# Patient Record
Sex: Male | Born: 1950 | Race: Black or African American | Hispanic: No | Smoking: Current every day smoker
Health system: Southern US, Community
[De-identification: ages and names within clinical notes are randomized; demographics above are authoritative.]

## PROBLEM LIST (undated history)

## (undated) DIAGNOSIS — S065X9A Traumatic subdural hemorrhage with loss of consciousness of unspecified duration, initial encounter: Secondary | ICD-10-CM

## (undated) DIAGNOSIS — K219 Gastro-esophageal reflux disease without esophagitis: Secondary | ICD-10-CM

## (undated) DIAGNOSIS — W3400XA Accidental discharge from unspecified firearms or gun, initial encounter: Secondary | ICD-10-CM

## (undated) DIAGNOSIS — S065XAA Traumatic subdural hemorrhage with loss of consciousness status unknown, initial encounter: Secondary | ICD-10-CM

## (undated) DIAGNOSIS — D496 Neoplasm of unspecified behavior of brain: Secondary | ICD-10-CM

## (undated) DIAGNOSIS — I639 Cerebral infarction, unspecified: Secondary | ICD-10-CM

## (undated) HISTORY — PX: OTHER SURGICAL HISTORY: SHX169

---

## 1997-12-27 ENCOUNTER — Emergency Department (HOSPITAL_COMMUNITY): Admission: EM | Admit: 1997-12-27 | Discharge: 1997-12-27 | Payer: Self-pay | Admitting: Emergency Medicine

## 1998-08-17 ENCOUNTER — Emergency Department (HOSPITAL_COMMUNITY): Admission: EM | Admit: 1998-08-17 | Discharge: 1998-08-17 | Payer: Self-pay | Admitting: Emergency Medicine

## 1999-03-02 ENCOUNTER — Emergency Department (HOSPITAL_COMMUNITY): Admission: EM | Admit: 1999-03-02 | Discharge: 1999-03-02 | Payer: Self-pay | Admitting: Emergency Medicine

## 1999-04-11 ENCOUNTER — Emergency Department (HOSPITAL_COMMUNITY): Admission: EM | Admit: 1999-04-11 | Discharge: 1999-04-11 | Payer: Self-pay | Admitting: Emergency Medicine

## 2000-01-06 ENCOUNTER — Encounter: Payer: Self-pay | Admitting: Emergency Medicine

## 2000-01-06 ENCOUNTER — Emergency Department (HOSPITAL_COMMUNITY): Admission: EM | Admit: 2000-01-06 | Discharge: 2000-01-06 | Payer: Self-pay | Admitting: Emergency Medicine

## 2000-02-15 ENCOUNTER — Emergency Department (HOSPITAL_COMMUNITY): Admission: EM | Admit: 2000-02-15 | Discharge: 2000-02-15 | Payer: Self-pay | Admitting: *Deleted

## 2000-02-15 ENCOUNTER — Encounter: Payer: Self-pay | Admitting: Emergency Medicine

## 2001-07-05 ENCOUNTER — Emergency Department (HOSPITAL_COMMUNITY): Admission: EM | Admit: 2001-07-05 | Discharge: 2001-07-05 | Payer: Self-pay | Admitting: Emergency Medicine

## 2002-09-21 ENCOUNTER — Emergency Department (HOSPITAL_COMMUNITY): Admission: EM | Admit: 2002-09-21 | Discharge: 2002-09-21 | Payer: Self-pay | Admitting: Emergency Medicine

## 2003-02-04 ENCOUNTER — Emergency Department (HOSPITAL_COMMUNITY): Admission: EM | Admit: 2003-02-04 | Discharge: 2003-02-04 | Payer: Self-pay

## 2003-07-16 ENCOUNTER — Emergency Department (HOSPITAL_COMMUNITY): Admission: EM | Admit: 2003-07-16 | Discharge: 2003-07-16 | Payer: Self-pay | Admitting: Emergency Medicine

## 2004-09-25 ENCOUNTER — Ambulatory Visit: Payer: Self-pay | Admitting: Internal Medicine

## 2004-09-28 ENCOUNTER — Ambulatory Visit: Payer: Self-pay | Admitting: *Deleted

## 2004-10-09 ENCOUNTER — Ambulatory Visit: Payer: Self-pay | Admitting: Family Medicine

## 2004-10-19 ENCOUNTER — Ambulatory Visit: Payer: Self-pay | Admitting: Internal Medicine

## 2005-09-10 ENCOUNTER — Ambulatory Visit: Payer: Self-pay | Admitting: Family Medicine

## 2005-10-06 ENCOUNTER — Ambulatory Visit: Payer: Self-pay | Admitting: Gastroenterology

## 2006-01-04 ENCOUNTER — Other Ambulatory Visit: Payer: Self-pay

## 2006-01-04 ENCOUNTER — Inpatient Hospital Stay: Payer: Self-pay | Admitting: Internal Medicine

## 2007-04-15 ENCOUNTER — Emergency Department: Payer: Self-pay | Admitting: Emergency Medicine

## 2007-04-15 ENCOUNTER — Other Ambulatory Visit: Payer: Self-pay

## 2007-04-27 ENCOUNTER — Ambulatory Visit: Payer: Self-pay | Admitting: Internal Medicine

## 2008-11-25 ENCOUNTER — Emergency Department: Payer: Self-pay | Admitting: Emergency Medicine

## 2009-04-08 ENCOUNTER — Emergency Department (HOSPITAL_COMMUNITY): Admission: EM | Admit: 2009-04-08 | Discharge: 2009-04-08 | Payer: Self-pay | Admitting: Emergency Medicine

## 2010-01-20 ENCOUNTER — Encounter: Payer: Self-pay | Admitting: Emergency Medicine

## 2010-01-20 ENCOUNTER — Inpatient Hospital Stay (HOSPITAL_COMMUNITY): Admission: EM | Admit: 2010-01-20 | Discharge: 2010-01-23 | Payer: Self-pay | Admitting: Neurosurgery

## 2010-03-12 ENCOUNTER — Ambulatory Visit (HOSPITAL_COMMUNITY): Admission: RE | Admit: 2010-03-12 | Discharge: 2010-03-12 | Payer: Self-pay | Admitting: Neurosurgery

## 2010-09-07 ENCOUNTER — Emergency Department: Payer: Self-pay | Admitting: Emergency Medicine

## 2010-09-08 ENCOUNTER — Inpatient Hospital Stay (HOSPITAL_COMMUNITY)
Admission: EM | Admit: 2010-09-08 | Discharge: 2010-09-18 | Payer: Self-pay | Attending: Neurosurgery | Admitting: Neurosurgery

## 2010-09-09 LAB — BASIC METABOLIC PANEL
BUN: 10 mg/dL (ref 6–23)
CO2: 27 mEq/L (ref 19–32)
Calcium: 9.8 mg/dL (ref 8.4–10.5)
Chloride: 104 mEq/L (ref 96–112)
Creatinine, Ser: 1.1 mg/dL (ref 0.4–1.5)
GFR calc Af Amer: >60 mL/min (ref >60)
GFR calc non Af Amer: >60 mL/min (ref >60)
Glucose, Bld: 227 mg/dL — ABNORMAL HIGH (ref 70–99)
Potassium: 4.5 mEq/L (ref 3.5–5.1)
Sodium: 139 mEq/L (ref 135–145)

## 2010-09-09 LAB — PROTIME-INR
INR: 0.92 (ref 0.00–1.49)
Prothrombin Time: 12.6 seconds (ref 11.6–15.2)

## 2010-09-09 LAB — CBC
HCT: 44.9 % (ref 39.0–52.0)
Hemoglobin: 15.7 g/dL (ref 13.0–17.0)
MCH: 29.6 pg (ref 26.0–34.0)
MCHC: 35 g/dL (ref 30.0–36.0)
MCV: 84.6 fL (ref 78.0–100.0)
Platelets: 262 10*3/uL (ref 150–400)
RBC: 5.31 MIL/uL (ref 4.22–5.81)
RDW: 12.7 % (ref 11.5–15.5)
WBC: 10.8 10*3/uL — ABNORMAL HIGH (ref 4.0–10.5)

## 2010-09-09 LAB — APTT: aPTT: 30 seconds (ref 24–37)

## 2010-09-13 ENCOUNTER — Encounter: Payer: Self-pay | Admitting: Neurosurgery

## 2010-09-14 LAB — GLUCOSE, CAPILLARY: Glucose-Capillary: 281 mg/dL — ABNORMAL HIGH (ref 70–99)

## 2010-09-15 ENCOUNTER — Other Ambulatory Visit: Payer: Self-pay | Admitting: Neurosurgery

## 2010-09-15 LAB — BASIC METABOLIC PANEL
Calcium: 9.6 mg/dL (ref 8.4–10.5)
Creatinine, Ser: 1.06 mg/dL (ref 0.4–1.5)
GFR calc Af Amer: >60 mL/min (ref >60)
GFR calc non Af Amer: >60 mL/min (ref >60)

## 2010-09-15 LAB — GLUCOSE, CAPILLARY
Glucose-Capillary: 409 mg/dL — ABNORMAL HIGH (ref 70–99)
Glucose-Capillary: 218 mg/dL — ABNORMAL HIGH (ref 70–99)
Glucose-Capillary: 328 mg/dL — ABNORMAL HIGH (ref 70–99)
Glucose-Capillary: 302 mg/dL — ABNORMAL HIGH (ref 70–99)
Glucose-Capillary: 251 mg/dL — ABNORMAL HIGH (ref 70–99)
Glucose-Capillary: 231 mg/dL — ABNORMAL HIGH (ref 70–99)

## 2010-09-15 LAB — CBC
MCH: 29.7 pg (ref 26.0–34.0)
MCHC: 36.3 g/dL — ABNORMAL HIGH (ref 30.0–36.0)
Platelets: 344 10*3/uL (ref 150–400)
RBC: 5.58 MIL/uL (ref 4.22–5.81)
RDW: 12.5 % (ref 11.5–15.5)

## 2010-09-15 LAB — HEMOGLOBIN A1C
Hgb A1c MFr Bld: 7.7 % — ABNORMAL HIGH (ref <5.7)
Mean Plasma Glucose: 174 mg/dL — ABNORMAL HIGH (ref <117)

## 2010-09-15 LAB — PROTIME-INR
INR: 0.86 (ref 0.00–1.49)
Prothrombin Time: 11.9 seconds (ref 11.6–15.2)

## 2010-09-16 LAB — CBC
HCT: 38.1 % — ABNORMAL LOW (ref 39.0–52.0)
Hemoglobin: 13.5 g/dL (ref 13.0–17.0)
RDW: 12.6 % (ref 11.5–15.5)
WBC: 16 10*3/uL — ABNORMAL HIGH (ref 4.0–10.5)

## 2010-09-16 LAB — POCT I-STAT 4, (NA,K, GLUC, HGB,HCT)
Glucose, Bld: 280 mg/dL — ABNORMAL HIGH (ref 70–99)
HCT: 43 % (ref 39.0–52.0)
Sodium: 132 mEq/L — ABNORMAL LOW (ref 135–145)

## 2010-09-16 LAB — GLUCOSE, CAPILLARY: Glucose-Capillary: 332 mg/dL — ABNORMAL HIGH (ref 70–99)

## 2010-09-16 LAB — BASIC METABOLIC PANEL
CO2: 22 mEq/L (ref 19–32)
GFR calc Af Amer: >60 mL/min (ref >60)
GFR calc non Af Amer: >60 mL/min (ref >60)
Glucose, Bld: 319 mg/dL — ABNORMAL HIGH (ref 70–99)
Potassium: 4.8 mEq/L (ref 3.5–5.1)
Sodium: 131 mEq/L — ABNORMAL LOW (ref 135–145)

## 2010-09-17 LAB — CROSSMATCH
Unit division: 0
Unit division: 0
Unit division: 0

## 2010-09-17 LAB — GLUCOSE, CAPILLARY
Glucose-Capillary: 191 mg/dL — ABNORMAL HIGH (ref 70–99)
Glucose-Capillary: 217 mg/dL — ABNORMAL HIGH (ref 70–99)

## 2010-09-18 LAB — GLUCOSE, CAPILLARY
Glucose-Capillary: 209 mg/dL — ABNORMAL HIGH (ref 70–99)
Glucose-Capillary: 330 mg/dL — ABNORMAL HIGH (ref 70–99)
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
## 2010-09-21 ENCOUNTER — Emergency Department: Payer: Self-pay | Admitting: Emergency Medicine

## 2010-09-22 ENCOUNTER — Inpatient Hospital Stay (HOSPITAL_COMMUNITY)
Admission: EM | Admit: 2010-09-22 | Discharge: 2010-09-24 | DRG: 947 | Disposition: A | Discharge status: Home Health | Payer: Medicaid Other | Attending: Internal Medicine | Admitting: Internal Medicine

## 2010-09-22 DIAGNOSIS — G936 Cerebral edema: Secondary | ICD-10-CM | POA: Diagnosis present

## 2010-09-22 DIAGNOSIS — K219 Gastro-esophageal reflux disease without esophagitis: Secondary | ICD-10-CM | POA: Diagnosis present

## 2010-09-22 DIAGNOSIS — R4182 Altered mental status, unspecified: Principal | ICD-10-CM | POA: Diagnosis present

## 2010-09-22 DIAGNOSIS — Z4889 Encounter for other specified surgical aftercare: Secondary | ICD-10-CM

## 2010-09-22 DIAGNOSIS — Y92009 Unspecified place in unspecified non-institutional (private) residence as the place of occurrence of the external cause: Secondary | ICD-10-CM

## 2010-09-22 DIAGNOSIS — R7309 Other abnormal glucose: Secondary | ICD-10-CM | POA: Diagnosis present

## 2010-09-22 DIAGNOSIS — F17200 Nicotine dependence, unspecified, uncomplicated: Secondary | ICD-10-CM | POA: Diagnosis present

## 2010-09-22 DIAGNOSIS — T380X5A Adverse effect of glucocorticoids and synthetic analogues, initial encounter: Secondary | ICD-10-CM | POA: Diagnosis present

## 2010-09-22 DIAGNOSIS — Z794 Long term (current) use of insulin: Secondary | ICD-10-CM

## 2010-09-22 LAB — URINALYSIS, ROUTINE W REFLEX MICROSCOPIC
Bilirubin Urine: NEGATIVE
Hgb urine dipstick: NEGATIVE
Ketones, ur: NEGATIVE mg/dL
Protein, ur: NEGATIVE mg/dL
Urobilinogen, UA: 1 mg/dL (ref 0.0–1.0)

## 2010-09-22 LAB — APTT: aPTT: 24 seconds (ref 24–37)

## 2010-09-22 LAB — CARDIAC PANEL(CRET KIN+CKTOT+MB+TROPI)
CK, MB: 0.6 ng/mL (ref 0.3–4.0)
Troponin I: <0.01 ng/mL (ref 0.00–0.06)

## 2010-09-22 LAB — CBC
HCT: 38.4 % — ABNORMAL LOW (ref 39.0–52.0)
MCHC: 33.3 g/dL (ref 30.0–36.0)
RDW: 12.5 % (ref 11.5–15.5)

## 2010-09-22 LAB — COMPREHENSIVE METABOLIC PANEL
ALT: 25 U/L (ref 0–53)
Albumin: 2.9 g/dL — ABNORMAL LOW (ref 3.5–5.2)
Alkaline Phosphatase: 61 U/L (ref 39–117)
GFR calc Af Amer: >60 mL/min (ref >60)
Potassium: 4.2 mEq/L (ref 3.5–5.1)
Sodium: 131 mEq/L — ABNORMAL LOW (ref 135–145)
Total Protein: 6.5 g/dL (ref 6.0–8.3)

## 2010-09-22 LAB — CK TOTAL AND CKMB (NOT AT ARMC): Relative Index: INVALID (ref 0.0–2.5)

## 2010-09-22 LAB — DIFFERENTIAL
Basophils Absolute: 0 10*3/uL (ref 0.0–0.1)
Eosinophils Relative: 0 % (ref 0–5)
Lymphocytes Relative: 24 % (ref 12–46)
Monocytes Absolute: 1.2 10*3/uL — ABNORMAL HIGH (ref 0.1–1.0)

## 2010-09-22 LAB — LIPID PANEL
HDL: 45 mg/dL (ref >39)
VLDL: 24 mg/dL (ref 0–40)

## 2010-09-22 LAB — RAPID URINE DRUG SCREEN, HOSP PERFORMED
Benzodiazepines: NOT DETECTED
Cocaine: NOT DETECTED
Opiates: POSITIVE — AB
Tetrahydrocannabinol: NOT DETECTED

## 2010-09-22 LAB — PROTIME-INR: INR: 0.89 (ref 0.00–1.49)

## 2010-09-22 LAB — POCT I-STAT 3, ART BLOOD GAS (G3+)
Bicarbonate: 25.5 mEq/L — ABNORMAL HIGH (ref 20.0–24.0)
TCO2: 26 mmol/L (ref 0–100)
pCO2 arterial: 32.6 mmHg — ABNORMAL LOW (ref 35.0–45.0)
pH, Arterial: 7.501 — ABNORMAL HIGH (ref 7.350–7.450)
pO2, Arterial: 114 mmHg — ABNORMAL HIGH (ref 80.0–100.0)

## 2010-09-22 LAB — POCT I-STAT, CHEM 8
Creatinine, Ser: 1.3 mg/dL (ref 0.4–1.5)
Glucose, Bld: 198 mg/dL — ABNORMAL HIGH (ref 70–99)
Hemoglobin: 13.9 g/dL (ref 13.0–17.0)
TCO2: 30 mmol/L (ref 0–100)

## 2010-09-22 LAB — TSH: TSH: 1.04 u[IU]/mL (ref 0.350–4.500)

## 2010-09-22 LAB — LACTIC ACID, PLASMA: Lactic Acid, Venous: 1.1 mmol/L (ref 0.5–2.2)

## 2010-09-23 ENCOUNTER — Inpatient Hospital Stay (HOSPITAL_COMMUNITY): Payer: Medicaid Other

## 2010-09-23 DIAGNOSIS — R609 Edema, unspecified: Secondary | ICD-10-CM

## 2010-09-23 LAB — GLUCOSE, CAPILLARY
Glucose-Capillary: 256 mg/dL — ABNORMAL HIGH (ref 70–99)
Glucose-Capillary: 171 mg/dL — ABNORMAL HIGH (ref 70–99)
Glucose-Capillary: 148 mg/dL — ABNORMAL HIGH (ref 70–99)
Glucose-Capillary: 124 mg/dL — ABNORMAL HIGH (ref 70–99)

## 2010-09-23 LAB — CARDIAC PANEL(CRET KIN+CKTOT+MB+TROPI)
Relative Index: INVALID (ref 0.0–2.5)
Troponin I: <0.01 ng/mL (ref 0.00–0.06)

## 2010-09-23 LAB — URINE CULTURE
Colony Count: NO GROWTH
Culture: NO GROWTH

## 2010-09-23 LAB — CBC
Hemoglobin: 13.4 g/dL (ref 13.0–17.0)
MCH: 29.5 pg (ref 26.0–34.0)
MCHC: 35.2 g/dL (ref 30.0–36.0)
Platelets: 243 10*3/uL (ref 150–400)
RDW: 12.6 % (ref 11.5–15.5)

## 2010-09-24 LAB — BASIC METABOLIC PANEL
BUN: 14 mg/dL (ref 6–23)
CO2: 20 mEq/L (ref 19–32)
Glucose, Bld: 195 mg/dL — ABNORMAL HIGH (ref 70–99)
Potassium: 4.2 mEq/L (ref 3.5–5.1)
Sodium: 131 mEq/L — ABNORMAL LOW (ref 135–145)

## 2010-09-24 LAB — CBC
HCT: 37.2 % — ABNORMAL LOW (ref 39.0–52.0)
Hemoglobin: 13.1 g/dL (ref 13.0–17.0)
MCHC: 35.2 g/dL (ref 30.0–36.0)
MCV: 83 fL (ref 78.0–100.0)

## 2010-09-24 LAB — GLUCOSE, CAPILLARY
Glucose-Capillary: 296 mg/dL — ABNORMAL HIGH (ref 70–99)
Glucose-Capillary: 151 mg/dL — ABNORMAL HIGH (ref 70–99)

## 2010-09-24 NOTE — Op Note (Signed)
NAMEMarland Kitchen  Willie Andersen, Willie Andersen                ACCOUNT NO.:  [id number]  MEDICAL RECORD NO.:  [id number]          PATIENT TYPE:  INP  LOCATION:  3172                         FACILITY:  [facility]  PHYSICIAN:  Clydene Fake, M.D.  DATE OF BIRTH:  1951-04-26  DATE OF PROCEDURE:  09/15/2010 DATE OF DISCHARGE:                              OPERATIVE REPORT   PREOPERATIVE DIAGNOSIS:  Brain tumor.  POSTOPERATIVE DIAGNOSIS:  Brain tumor.  PROCEDURE:  Stealth stereotactic craniotomy for tumor resection.  SURGEON:  Clydene Fake, M.D.  ASSISTANT:  Dr. Ezzard Standing.  ANESTHESIA:  General endotracheal tube anesthesia.  ESTIMATED BLOOD LOSS:  150 mL.  BLOOD GIVEN:  None.  DRAINS:  None.  COMPLICATIONS:  None.  PATHOLOGY:  Sellar tumor, otherwise undetermined.  Await permanent sections.  REASON FOR PROCEDURE:  The patient is a 60 year old gentleman who was having left-sided facial weakness and CT done showing brain mass on the right.  An  MRI was done showing what looked like an extra-axial tumor and lateral sphenoid ridge meningioma with some surrounding edema.  The patient was on IV steroids and then brought in for resection of tumor.  PROCEDURE IN DETAIL:  The patient was brought to the operating room. General anesthesia was induced and then a Stealth frameless stereotactic system was set up overlying the patient with the MRI scan.  We then planned craniotomy incision around where the tumor was.  The patient was prepped and draped in a sterile fashion.  Site of incision was injected with 20 mL of 1% lidocaine with epinephrine.  Then a question mark incision was made over the right frontal temporal parietal area.  This incision was taken down to the periosteum with needle tip Bovie, and scalp flap was reflected anteriorly, held with fishhooks.  I then again used the Stealth frameless stereotactic system to begin to find where the tumor was and planned our craniotomy.  A single bur hole was made  in the anterior-inferior frontal area to strip the dura from the bone edge, and a craniotome was used to elevate the bone flap to get more of a temporal craniectomy with Leksell rongeurs, maintained hemostasis of the bone edges with the bone wax and Gelfoam and thrombin.  Hemostasis of the dura with bipolar cauterization.  Again, I used the Stealth system to find where the tumor was in the dura, and then opened the dura around where the tumor was.  I went all the way around the tumor including down the sphenoid wing right up in the middle meningeal, and then cutting through that.  The tumor was then in the inferior aspect, actually separated from the brain usually and then we put some patties between the tumor and brain, and tried to mobilize the tumor, but it was more permanently firmly attached to the pia.  In the posterior aspect, similarly removed the dura and some tumor from the lateral part of the tumor and took some central tumor and sent that off for frozen section, which came back highly cellular tumor.  We used an ultrasonic aspirator for debulking of the tumor, and we carefully removed the tumor  edges into the center separating the tumor from the brain and protecting the brain with patties.  Good hemostasis with bipolar cauterization once the tumor was removed.  Patties were all removed, we got hemostasis with bipolar cauterization with Gelfoam and thrombin.  Gelfoam was irrigated out with good hemostasis and the tumor bed was lined with Surgicel. There were some dural thickening around the dural edges and some parts of this was bipolared.  The dura was then cut to the appropriate size and placed over the __________ tumor and this was tacked in the patient's dura.  Some tack up sutures were then placed into the dural edges.  Bone flap was then placed back into position and held in place with a microplating system.  The scalp flap was then brought back into position in the  temporalis fascia with 2-0 Vicryl interrupted sutures. Donnetta Hutching closed with the same.  Skin closed with staples.  Dressing was placed.  The patient was removed from head pins, awoke from anesthesia, and transferred to the recovery room in stable condition.          ______________________________ Clydene Fake, M.D.     JRH/MEDQ  D:  09/15/2010  T:  09/16/2010  Job:  [id number]  Electronically Signed by Colon Branch M.D. on 09/24/2010 09:14:03 AM

## 2010-10-07 NOTE — Discharge Summary (Signed)
NAME:  Willie Andersen, Willie Andersen                ACCOUNT NO.:  [id number]  MEDICAL RECORD NO.:  [id number]           PATIENT TYPE:  I  LOCATION:  3016                         FACILITY:  [facility]  PHYSICIAN:  Peggye Pitt, M.D. DATE OF BIRTH:  1950/12/06  DATE OF ADMISSION:  09/22/2010 DATE OF DISCHARGE:  09/24/2010                              DISCHARGE SUMMARY   NEW PRIMARY CARE PHYSICIAN:  Dr. Jiles Garter at the [facility] in [place], phone number is [phone].  NEUROSURGEON:  Clydene Fake, MD  DISCHARGE DIAGNOSES: 1. Altered mental status, resolved, presumed to be multifactorial     secondary to rapid discontinuation of steroids and brain edema     plus/minus hyperglycemia. 2. Hyperglycemia without diagnosis of diabetes mellitus. 3. 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     Phoebe Perch. 4. Gastroesophageal reflux disease.  DISCHARGE MEDICATIONS: 1. Decadron to take 2 mg twice daily for 2 days then 2 mg daily for 2     days and then discontinue. 2. Lantus insulin to take 10 units on Friday and Saturday and then 5     units on Sunday and Monday and then discontinue. 3. Keppra 500 mg twice daily. 4. Vicodin 5/500 mg 1-2 tablets every 6-8 hours as needed for pain.  DISPOSITION AND FOLLOWUP:  Willie Andersen will be discharged home today in stable and improved condition.  We have secured a followup appointment with a new PCP, Dr. Jiles Garter in [place] for September 30, 2010, at 1 p.m. Willie Andersen has developed some hyperglycemia secondary to steroid use most likely as he has not been yet diagnosed with diabetes.  We will send him home on a tapering insulin dose.  He is instructed to take his blood sugars twice daily before breakfast and before dinner and to stop taking insulin immediately if his blood sugar drops below 70.  We will also have a home health RN come out to the house for the first few days just to make sure that he is doing the insulin and taking his  blood sugars adequately.  CONSULTATION THIS HOSPITALIZATION:  Clydene Fake, MD  IMAGES AND PROCEDURES:  A CT scan of the head on September 22, 2010, that showed postop changes in the right temporal region with extra-axial gas, but no measurable extra-axial fluid collections.  Vasogenic edema within the right frontal and temporal lobes does not appear changed, but the degree of right to left midline shift has improved.  No acute hemorrhage.  Chest x-ray on September 22, 2010, that shows stable mild cardiomegaly and probable COPD with no acute findings.  A repeat CT head on September 23, 2010, that showed no significant change in vasogenic edema in the right frontal and temporal lobes with mild right-to-left midline shift.  No acute intracranial hemorrhage.  HISTORY AND PHYSICAL:  For full details, see dictation by Dr. Susie Cassette on September 22, 2010, but in brief Willie Andersen is a 60 year old African American gentleman who on September 16, 2010, had a biopsy of her brain tumor by Dr. Phoebe Perch who was discharged 2 days prior to admission home and  returned to [facility] the day prior because his sugars were running high and the patient was "confused" as per family members. Apparently sugar was above 500.  They gave him some insulin and sent him home; however, he returned to the emergency department on day of admission again with confusion where his blood sugar was found to be 450.  Because of this, we were asked to admit him for further evaluation and management.  HOSPITAL COURSE BY PROBLEM: 1. Altered mental status.  This has completely resolved at this time.     We believe at this point that this was multifactorial secondary to     rapid discontinuation of steroids as well as his hyperglycemia. 2. Brain tumor with pathology pending status post recent biopsy on     September 16, 2010.  Dr. Phoebe Perch has restarted him on a tapering dose     of steroids, please see above for details.  Dr. Phoebe Perch  would like     to see him in the office on Monday, which is 4 days after discharge     from the hospital. 3. Hyperglycemia.  Willie Andersen does not have a formal diagnosis of     diabetes, so we have to assume that his hypoglycemia is secondary     to steroid use.  Since he will be on a tapering dose of Decadron, I     will also send him home on a tapering insulin dose.  He is     instructed to take his CBGs twice daily and to discontinue insulin     immediately if his sugars at any point drop below 70.  He has an     appointment with a new primary care physician that we have arranged     for Wednesday. 4. All his chronic conditions have been stable.  VITALS ON DAY OF DISCHARGE:  Blood pressure 125/75, heart rate 73, respirations 18, sats are 96% on room air, and temperature 98.7.     Peggye Pitt, M.D.     EH/MEDQ  D:  09/24/2010  T:  09/25/2010  Job:  [id number]  cc:   Dr. Georgette Shell, M.D.  Electronically Signed by Peggye Pitt M.D. on 10/07/2010 08:10:05 AM

## 2010-10-13 NOTE — Discharge Summary (Signed)
  NAMEBURKE, Willie                ACCOUNT NO.:  [id number]  MEDICAL RECORD NO.:  [id number]          PATIENT TYPE:  INP  LOCATION:  3112                         FACILITY:  [facility]  PHYSICIAN:  Clydene Fake, M.D.  DATE OF BIRTH:  1951-04-16  DATE OF ADMISSION:  09/08/2010 DATE OF DISCHARGE:  09/18/2010                              DISCHARGE SUMMARY   ADMITTING DIAGNOSIS:  Right lateral sphenoid wing meningioma.  DISCHARGE DIAGNOSIS:  Right lateral sphenoid wing meningioma.  PROCEDURES:  Craniotomy and resection of meningioma.  REASON FOR ADMISSION:  He was transferred from [facility] for some left facial weakness, found to have a right brain mass.  That was on the 17th following day since the patient was a prior patient of ours. The patient was transferred to my service on the 18th.  An MRI was done showing a right lateral sphenoid wing meningioma with edema.  The patient was on IV steroids to decrease swelling and we worked on scheduling surgery to resect the tumor.  The patient remained stable and then on September 15, 2010, the patient underwent surgery, craniotomy and tumor resection.  Frozen section showed a cellular tumor, but we now know no final path was meningioma.  Postop the patient was transferred to the recovery room and then to the ICU where he had done well.  On the following day, he awake, alert, and oriented x3.  No drift.  Cranial nerves were intact other than very slight left facial weakness, similar to preop status.  We are increasing his activity and transfer to the floor.  Continued to make good progress.  He was up ambulating, tolerating p.o. and the patient was discharged home on September 18, 2010, in stable condition.  Incision was clean, dry, and intact.  Will keep incision dry.  No strenuous activity.  Follow up in about a week or so for staple removal.  DISCHARGE MEDS:  Same as prehospitalization plus Decadron to be a weaning dose over the next  4 days, Keppra 500 b.i.d., Vicodin p.r.n. pain.          ______________________________ Clydene Fake, M.D.     JRH/MEDQ  D:  10/04/2010  T:  10/05/2010  Job:  [id number]  Electronically Signed by Colon Branch M.D. on 10/13/2010 08:32:55 AM

## 2010-10-18 NOTE — H&P (Signed)
NAME:  Willie Andersen, Willie Andersen                ACCOUNT NO.:  [id number]  MEDICAL RECORD NO.:  [id number]          PATIENT TYPE:  INP  LOCATION:  1824                         FACILITY:  [facility]  PHYSICIAN:  Richarda Overlie, MD       DATE OF BIRTH:  Nov 30, 1950  DATE OF ADMISSION:  09/22/2010 DATE OF DISCHARGE:                             HISTORY & PHYSICAL   PRIMARY CARE PHYSICIAN:  The patient has unassigned, his new primary neurosurgeon is Dr. Clydene Fake, MD.  CHIEF COMPLAINT:  Altered mental status.  SUBJECTIVE:  This is a 60 year old male with a history of recent surgery for sellar tumor by Dr. Colon Branch on September 15, 2010.  Discharged 2 days ago to home, who presented to [facility] ER yesterday because the sugars were running high and the patient was non acting "his normal self."  The patient provides me with very limited history, so I had to call the patient's wife, Willie Andersen, at [phone] and get the history from her.  According to her, the patient has been home for the last 2 days and has not been acting his usual self.  He has been sluggish, weak, and the weakness is generalized without any focal neurologic deficits or unilateral weakness.  There is no evidence of any slurred speech, dysarthria, or difficulty with ambulation.  However, the patient has been incoherent on several occasions and his sugars have been running high to about greater than 500.  He went to [facility] yesterday and was there between 7 and 10 p.m. and received insulin and was sent home.  Following his discharge from [facility] ED yesterday, the patient felt well for few hours and then started getting more sluggish and confused again.  According to the wife, the patient has been yelling out and has not been making sense and on other occasions his speech seems to be completely clear.  PAST MEDICAL HISTORY:  Gastroesophageal reflux disease and brain tumor.  FAMILY HISTORY:  To see several years  ago, the wife is not able to tell me if the mother and father had any spastic health issues.  Sister has diabetes.  SOCIAL HISTORY:  The patient smokes one pack in 2 to 3 days.  He does have a history of cocaine and alcohol use.  PAST SURGICAL HISTORY:  Excision of brain tumor by Dr. Colon Branch on September 15, 2010.  ALLERGIES:  No known drug allergies.  HOME MEDICATIONS:  Dexamethasone, Keppra and Vicodin.  PHYSICAL EXAMINATION:  VITAL SIGNS:  Blood pressure 116/72, pulse 76, respirations 20, temperature 98.8. GENERAL:  The patient has waxing and waning mental status, but easily redirected and his speech is clear. HEENT:  Pupils are equal and reactive.  Extraocular movements are intact. NECK:  Supple.  No JVD. LUNGS:  Clear to auscultation bilaterally.  No wheezes, crackles or rhonchi. CARDIOVASCULAR:  Regular rate and rhythm.  No murmurs, rubs, or gallops. ABDOMEN:  Soft, nontender, and nondistended. EXTREMITIES:  Without cyanosis, clubbing, or edema. NEUROLOGIC:  Cranial nerves II through XII appear to be grossly nonfocal.  He does not appear to have any meningismus.  His motor strength is intact in bilateral upper and lower extremities.  Gait not tested. PSYCHIATRIC:  Appropriate, but the patient is alert. LYMPHATIC:  No axillary, inguinal, or cervical lymphadenopathy.  LABS:  CBG of 209.  Sodium 131, potassium 4.3, chloride 97, bicarb 198, BUN 14, creatinine 1.3.  WBC 12.7, hemoglobin 13.9, hematocrit 41.0, and platelet count of 237.  Urinalysis negative.  CBC:  WBC 12.7, hemoglobin 12.8, hematocrit 38.4, and platelets gone up to 37.  CT of the head without contrast shows postoperative changes in the right temporal region with extra-axial gas, but no measurable extra- axial fluid collection, vasogenic edema within the right frontal and temporal lobe.  It does not appear changed, but the degree of right to left midline shift is improved without any acute  hemorrhage.  ASSESSMENT AND PLAN:  A 60 year old male with a recent history of stereotactic craniotomy and tumor resection.  Surgical pathology consistent with grade 1 meningioma who presents to the ED with altered mental status and confusion for the last 2 days.  Differential diagnosis could include an infectious process, namely meningitis/pneumonia/urinary tract infection.  The patient denies any history of seizures.  The patient also could be extremely dehydrated because of his elevated CBG. The patient's lab work is currently incomplete and inconclusive for me to determine if the patient has any evidence of diabetic ketoacidosis. His complete BMET is pending. Because of his waxing and waning mental status.  We will also check an ABG and a lactic acid and EtOH level and a urine drug screen because of his history of cocaine abuse.  According to the wife, the patient has not had any alcohol in the last 2 weeks. The patient would probably also benefit from IR-guided lumbar puncture to rule out meningitis.  Will empirically cover him with vancomycin and Rocephin for meningitis.  Continue his Keppra at 500 mg IV b.i.d. for seizure prophylaxis.  We will place him on Lantus and sliding scale insulin with q.4 h. coverage.  The patient does not have a history of diabetes.  However, he does have a family history of diabetes.  Therefore, we will check a hemoglobin A1c, hold his Decadron for now.  Dr. Phoebe Perch has already been consulted by the ER physician.  He is a full code.     Richarda Overlie, MD     NA/MEDQ  D:  09/22/2010  T:  09/22/2010  Job:  [id number]  Electronically Signed by Richarda Overlie MD on 10/18/2010 07:36:49 AM

## 2010-10-20 ENCOUNTER — Other Ambulatory Visit (HOSPITAL_COMMUNITY): Payer: Self-pay | Admitting: Neurosurgery

## 2010-10-20 DIAGNOSIS — R531 Weakness: Secondary | ICD-10-CM

## 2010-10-20 DIAGNOSIS — R51 Headache: Secondary | ICD-10-CM

## 2010-10-22 ENCOUNTER — Ambulatory Visit (HOSPITAL_COMMUNITY)
Admission: RE | Admit: 2010-10-22 | Discharge: 2010-10-22 | Disposition: A | Discharge status: Home / Self Care | Payer: Medicaid Other | Source: Ambulatory Visit | Attending: Neurosurgery | Admitting: Neurosurgery

## 2010-10-22 DIAGNOSIS — D320 Benign neoplasm of cerebral meninges: Secondary | ICD-10-CM | POA: Insufficient documentation

## 2010-10-22 DIAGNOSIS — R51 Headache: Secondary | ICD-10-CM | POA: Insufficient documentation

## 2010-10-22 DIAGNOSIS — M6281 Muscle weakness (generalized): Secondary | ICD-10-CM | POA: Insufficient documentation

## 2010-10-22 DIAGNOSIS — R531 Weakness: Secondary | ICD-10-CM

## 2010-10-22 LAB — CREATININE, SERUM: GFR calc non Af Amer: >60 mL/min (ref >60)

## 2010-10-22 LAB — BUN: BUN: 6 mg/dL (ref 6–23)

## 2010-10-22 MED ORDER — IOHEXOL 300 MG/ML  SOLN
100.0000 mL | Freq: Once | INTRAMUSCULAR | Status: AC | PRN
  Administered 2010-10-22: 100 mL via INTRAVENOUS

## 2010-11-09 LAB — BASIC METABOLIC PANEL
CO2: 25 mEq/L (ref 19–32)
CO2: 29 mEq/L (ref 19–32)
Calcium: 8.7 mg/dL (ref 8.4–10.5)
Chloride: 103 mEq/L (ref 96–112)
Chloride: 99 mEq/L (ref 96–112)
GFR calc Af Amer: >60 mL/min (ref >60)
GFR calc Af Amer: >60 mL/min (ref >60)
Glucose, Bld: 133 mg/dL — ABNORMAL HIGH (ref 70–99)
Potassium: 3.6 mEq/L (ref 3.5–5.1)
Potassium: 3.9 mEq/L (ref 3.5–5.1)
Sodium: 138 mEq/L (ref 135–145)

## 2010-11-09 LAB — CBC
HCT: 40 % (ref 39.0–52.0)
Hemoglobin: 13.8 g/dL (ref 13.0–17.0)
Hemoglobin: 17.2 g/dL — ABNORMAL HIGH (ref 13.0–17.0)
MCHC: 34.5 g/dL (ref 30.0–36.0)
MCHC: 34.2 g/dL (ref 30.0–36.0)
MCV: 91.8 fL (ref 78.0–100.0)
Platelets: 287 10*3/uL (ref 150–400)
RBC: 4.36 MIL/uL (ref 4.22–5.81)
RDW: 13 % (ref 11.5–15.5)
WBC: 6.6 10*3/uL (ref 4.0–10.5)

## 2010-11-09 LAB — DIFFERENTIAL
Basophils Absolute: 0.1 10*3/uL (ref 0.0–0.1)
Basophils Relative: 0 % (ref 0–1)
Eosinophils Relative: 0 % (ref 0–5)
Monocytes Absolute: 0.5 10*3/uL (ref 0.1–1.0)
Neutro Abs: 13.5 10*3/uL — ABNORMAL HIGH (ref 1.7–7.7)

## 2010-11-09 LAB — GLUCOSE, CAPILLARY
Glucose-Capillary: 164 mg/dL — ABNORMAL HIGH (ref 70–99)
Glucose-Capillary: 165 mg/dL — ABNORMAL HIGH (ref 70–99)

## 2010-11-09 LAB — POCT I-STAT, CHEM 8
BUN: 8 mg/dL (ref 6–23)
Calcium, Ion: 1.1 mmol/L — ABNORMAL LOW (ref 1.12–1.32)
Creatinine, Ser: 1.3 mg/dL (ref 0.4–1.5)
TCO2: 24 mmol/L (ref 0–100)

## 2010-11-09 LAB — APTT: aPTT: 26 seconds (ref 24–37)

## 2010-11-09 LAB — RAPID URINE DRUG SCREEN, HOSP PERFORMED
Benzodiazepines: NOT DETECTED
Cocaine: POSITIVE — AB
Opiates: NOT DETECTED
Tetrahydrocannabinol: NOT DETECTED

## 2010-11-09 LAB — PROTIME-INR: Prothrombin Time: 13 seconds (ref 11.6–15.2)

## 2010-11-28 LAB — SEDIMENTATION RATE: Sed Rate: 2 mm/hr (ref 0–16)
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
## 2011-03-23 ENCOUNTER — Other Ambulatory Visit (HOSPITAL_COMMUNITY): Payer: Self-pay | Admitting: Neurosurgery

## 2011-03-23 DIAGNOSIS — D496 Neoplasm of unspecified behavior of brain: Secondary | ICD-10-CM

## 2011-03-23 DIAGNOSIS — R4182 Altered mental status, unspecified: Secondary | ICD-10-CM

## 2011-03-25 ENCOUNTER — Ambulatory Visit (HOSPITAL_COMMUNITY)
Admission: RE | Admit: 2011-03-25 | Discharge: 2011-03-25 | Disposition: A | Discharge status: Home / Self Care | Payer: Medicaid Other | Source: Ambulatory Visit | Attending: Neurosurgery | Admitting: Neurosurgery

## 2011-03-25 DIAGNOSIS — R51 Headache: Secondary | ICD-10-CM | POA: Insufficient documentation

## 2011-03-25 DIAGNOSIS — R4182 Altered mental status, unspecified: Secondary | ICD-10-CM

## 2011-03-25 DIAGNOSIS — D320 Benign neoplasm of cerebral meninges: Secondary | ICD-10-CM | POA: Insufficient documentation

## 2011-03-25 DIAGNOSIS — M6281 Muscle weakness (generalized): Secondary | ICD-10-CM | POA: Insufficient documentation

## 2011-03-25 DIAGNOSIS — G9389 Other specified disorders of brain: Secondary | ICD-10-CM | POA: Insufficient documentation

## 2011-03-25 DIAGNOSIS — D496 Neoplasm of unspecified behavior of brain: Secondary | ICD-10-CM

## 2011-03-25 MED ORDER — GADOBENATE DIMEGLUMINE 529 MG/ML IV SOLN: 20.0000 mL | Freq: Once | INTRAVENOUS | Status: DC

## 2011-09-13 ENCOUNTER — Emergency Department (HOSPITAL_COMMUNITY): Payer: Medicaid Other

## 2011-09-13 ENCOUNTER — Encounter (HOSPITAL_COMMUNITY): Payer: Self-pay

## 2011-09-13 ENCOUNTER — Other Ambulatory Visit: Payer: Self-pay

## 2011-09-13 ENCOUNTER — Inpatient Hospital Stay (HOSPITAL_COMMUNITY)
Admission: EM | Admit: 2011-09-13 | Discharge: 2011-09-21 | DRG: 065 | Disposition: A | Discharge status: Skilled Nursing Facility | Payer: Medicaid Other | Attending: Infectious Diseases | Admitting: Infectious Diseases

## 2011-09-13 DIAGNOSIS — I10 Essential (primary) hypertension: Secondary | ICD-10-CM | POA: Diagnosis present

## 2011-09-13 DIAGNOSIS — G8190 Hemiplegia, unspecified affecting unspecified side: Secondary | ICD-10-CM | POA: Diagnosis present

## 2011-09-13 DIAGNOSIS — Z720 Tobacco use: Secondary | ICD-10-CM

## 2011-09-13 DIAGNOSIS — R29810 Facial weakness: Secondary | ICD-10-CM | POA: Diagnosis present

## 2011-09-13 DIAGNOSIS — Z86011 Personal history of benign neoplasm of the brain: Secondary | ICD-10-CM

## 2011-09-13 DIAGNOSIS — I6350 Cerebral infarction due to unspecified occlusion or stenosis of unspecified cerebral artery: Principal | ICD-10-CM

## 2011-09-13 DIAGNOSIS — D329 Benign neoplasm of meninges, unspecified: Secondary | ICD-10-CM

## 2011-09-13 DIAGNOSIS — R109 Unspecified abdominal pain: Secondary | ICD-10-CM | POA: Diagnosis not present

## 2011-09-13 DIAGNOSIS — Z8673 Personal history of transient ischemic attack (TIA), and cerebral infarction without residual deficits: Secondary | ICD-10-CM

## 2011-09-13 DIAGNOSIS — E785 Hyperlipidemia, unspecified: Secondary | ICD-10-CM | POA: Diagnosis present

## 2011-09-13 DIAGNOSIS — E119 Type 2 diabetes mellitus without complications: Secondary | ICD-10-CM | POA: Diagnosis present

## 2011-09-13 DIAGNOSIS — R110 Nausea: Secondary | ICD-10-CM | POA: Diagnosis not present

## 2011-09-13 DIAGNOSIS — I639 Cerebral infarction, unspecified: Secondary | ICD-10-CM

## 2011-09-13 DIAGNOSIS — F1410 Cocaine abuse, uncomplicated: Secondary | ICD-10-CM | POA: Diagnosis present

## 2011-09-13 DIAGNOSIS — E1149 Type 2 diabetes mellitus with other diabetic neurological complication: Secondary | ICD-10-CM

## 2011-09-13 DIAGNOSIS — F17200 Nicotine dependence, unspecified, uncomplicated: Secondary | ICD-10-CM | POA: Diagnosis present

## 2011-09-13 DIAGNOSIS — K219 Gastro-esophageal reflux disease without esophagitis: Secondary | ICD-10-CM

## 2011-09-13 HISTORY — DX: Neoplasm of unspecified behavior of brain: D49.6

## 2011-09-13 HISTORY — DX: Accidental discharge from unspecified firearms or gun, initial encounter: W34.00XA

## 2011-09-13 HISTORY — DX: Traumatic subdural hemorrhage with loss of consciousness of unspecified duration, initial encounter: S06.5X9A

## 2011-09-13 HISTORY — DX: Cerebral infarction, unspecified: I63.9

## 2011-09-13 HISTORY — DX: Traumatic subdural hemorrhage with loss of consciousness status unknown, initial encounter: S06.5XAA

## 2011-09-13 HISTORY — DX: Gastro-esophageal reflux disease without esophagitis: K21.9

## 2011-09-13 LAB — URINALYSIS, ROUTINE W REFLEX MICROSCOPIC
Glucose, UA: >1000 mg/dL — AB
Hgb urine dipstick: NEGATIVE
Ketones, ur: 15 mg/dL — AB
Leukocytes, UA: NEGATIVE
pH: 6 (ref 5.0–8.0)

## 2011-09-13 LAB — CBC
HCT: 48.9 % (ref 39.0–52.0)
Hemoglobin: 17.8 g/dL — ABNORMAL HIGH (ref 13.0–17.0)
MCH: 31.7 pg (ref 26.0–34.0)
MCHC: 36.4 g/dL — ABNORMAL HIGH (ref 30.0–36.0)
MCV: 87.2 fL (ref 78.0–100.0)
Platelets: 260 K/uL (ref 150–400)
RBC: 5.61 MIL/uL (ref 4.22–5.81)
RDW: 12.2 % (ref 11.5–15.5)
WBC: 10.6 K/uL — ABNORMAL HIGH (ref 4.0–10.5)

## 2011-09-13 LAB — DIFFERENTIAL
Basophils Absolute: 0 10*3/uL (ref 0.0–0.1)
Lymphocytes Relative: 22 % (ref 12–46)
Monocytes Absolute: 0.6 10*3/uL (ref 0.1–1.0)
Monocytes Relative: 6 % (ref 3–12)
Neutro Abs: 7.5 10*3/uL (ref 1.7–7.7)

## 2011-09-13 LAB — COMPREHENSIVE METABOLIC PANEL
AST: 23 U/L (ref 0–37)
CO2: 23 mEq/L (ref 19–32)
Chloride: 96 mEq/L (ref 96–112)
Creatinine, Ser: 0.93 mg/dL (ref 0.50–1.35)
GFR calc non Af Amer: 88 mL/min — ABNORMAL LOW (ref >90)
Total Bilirubin: 1.1 mg/dL (ref 0.3–1.2)

## 2011-09-13 LAB — POCT I-STAT, CHEM 8
Creatinine, Ser: 1.1 mg/dL (ref 0.50–1.35)
Glucose, Bld: 280 mg/dL — ABNORMAL HIGH (ref 70–99)
HCT: 54 % — ABNORMAL HIGH (ref 39.0–52.0)
Hemoglobin: 18.4 g/dL — ABNORMAL HIGH (ref 13.0–17.0)
Sodium: 136 mEq/L (ref 135–145)
TCO2: 26 mmol/L (ref 0–100)

## 2011-09-13 LAB — URINE CULTURE
Colony Count: >=100000
Culture  Setup Time: 201301211828

## 2011-09-13 LAB — APTT: aPTT: 25 s (ref 24–37)

## 2011-09-13 LAB — CK TOTAL AND CKMB (NOT AT ARMC)
CK, MB: 2.4 ng/mL (ref 0.3–4.0)
Relative Index: 1.4 (ref 0.0–2.5)
Total CK: 166 U/L (ref 7–232)

## 2011-09-13 MED ORDER — ACETAMINOPHEN 650 MG RE SUPP: 650.0000 mg | RECTAL | Status: DC | PRN

## 2011-09-13 MED ORDER — SENNOSIDES-DOCUSATE SODIUM 8.6-50 MG PO TABS: 1.0000 | ORAL_TABLET | Freq: Every evening | ORAL | Status: DC | PRN

## 2011-09-13 MED ORDER — ACETAMINOPHEN 325 MG PO TABS
650.0000 mg | ORAL_TABLET | ORAL | Status: DC | PRN
  Administered 2011-09-14 – 2011-09-21 (×7): 650 mg via ORAL
  Filled 2011-09-13 (×7): qty 2

## 2011-09-13 MED ORDER — ENOXAPARIN SODIUM 40 MG/0.4ML ~~LOC~~ SOLN
40.0000 mg | SUBCUTANEOUS | Status: DC
  Administered 2011-09-13 – 2011-09-20 (×8): 40 mg via SUBCUTANEOUS
  Filled 2011-09-13 (×9): qty 0.4

## 2011-09-13 MED ORDER — ASPIRIN 300 MG RE SUPP
300.0000 mg | Freq: Every day | RECTAL | Status: DC
  Filled 2011-09-13 (×3): qty 1

## 2011-09-13 MED ORDER — SODIUM CHLORIDE 0.9 % IV SOLN
INTRAVENOUS | Status: DC
  Administered 2011-09-13 – 2011-09-16 (×5): via INTRAVENOUS

## 2011-09-13 MED ORDER — ASPIRIN 325 MG PO TABS
325.0000 mg | ORAL_TABLET | Freq: Every day | ORAL | Status: DC
  Administered 2011-09-13 – 2011-09-16 (×4): 325 mg via ORAL
  Filled 2011-09-13 (×5): qty 1

## 2011-09-13 MED ORDER — ONDANSETRON HCL 4 MG/2ML IJ SOLN
4.0000 mg | Freq: Four times a day (QID) | INTRAMUSCULAR | Status: DC | PRN
  Administered 2011-09-17 – 2011-09-18 (×3): 4 mg via INTRAVENOUS
  Filled 2011-09-13 (×3): qty 2

## 2011-09-13 NOTE — ED Provider Notes (Signed)
History     CSN: [id number]  Arrival date & time 09/13/11  1517   First MD Initiated Contact with Patient 09/13/11 1536      Chief Complaint  Patient presents with  . Code Stroke    (Consider location/radiation/quality/duration/timing/severity/associated sxs/prior treatment) The history is provided by the patient and the EMS personnel. The history is limited by the condition of the patient.   Patient presents with acute onset of left-sided weakness and trouble speaking when he awoke this morning. Patient was last seen normal at 10:30 last night. 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 Patient tried to get out of the bed this morning but was unable to and fell to the floor. There was no loss of consciousness, however the patient was able to ambulate. He does have a history of left-sided weakness but not as severe as today. He denies any neck pain, chest pain, abdominal pain. He lives in a boarding house in was found on the floor by some roommates who called EMS Past Medical History  Diagnosis Date  . Stroke     No past surgical history on file.  No family history on file.  History  Substance Use Topics  . Smoking status: Not on file  . Smokeless tobacco: Not on file  . Alcohol Use:       Review of Systems  Unable to perform ROS   Allergies  Review of patient's allergies indicates no known allergies.  Home Medications  No current outpatient prescriptions on file.  BP 132/88  Pulse 73  Temp(Src) 97.9 F (36.6 C) (Oral)  Resp 14  SpO2 99%  Physical Exam  Nursing note and vitals reviewed. Constitutional: He is oriented to person, place, and time. Vital signs are normal. He appears well-developed and well-nourished. He is cooperative.  Non-toxic appearance. He has a sickly appearance. No distress.  HENT:  Head: Normocephalic and atraumatic.  Eyes: Conjunctivae, EOM and lids are normal. Pupils are equal, round, and reactive to light.  Neck: Normal range of motion.  Neck supple. No tracheal deviation present. No mass present.  Cardiovascular: Normal rate, regular rhythm and normal heart sounds.  Exam reveals no gallop.   No murmur heard. Pulmonary/Chest: Effort normal and breath sounds normal. No stridor. No respiratory distress. He has no decreased breath sounds. He has no wheezes. He has no rhonchi. He has no rales.  Abdominal: Soft. Normal appearance and bowel sounds are normal. He exhibits no distension. There is no tenderness. There is no rebound and no CVA tenderness.  Musculoskeletal: Normal range of motion. He exhibits no edema and no tenderness.  Neurological: He is alert and oriented to person, place, and time. He has normal strength. A cranial nerve deficit and sensory deficit is present. GCS eye subscore is 4. GCS verbal subscore is 5. GCS motor subscore is 6.       Patient has right-sided gaze, left-sided hemiparesis. Slightly slurred speech  Skin: Skin is warm and dry. No abrasion and no rash noted.  Psychiatric: His behavior is normal. His affect is blunt. His speech is delayed.    ED Course  Procedures (including critical care time)  Labs Reviewed  CBC - Abnormal; Notable for the following:    WBC 10.6 (*)    Hemoglobin 17.8 (*)    MCHC 36.4 (*)    All other components within normal limits  POCT I-STAT, CHEM 8 - Abnormal; Notable for the following:    BUN 5 (*)    Glucose,  Bld 280 (*)    Hemoglobin 18.4 (*)    HCT 54.0 (*)    All other components within normal limits  DIFFERENTIAL  PROTIME-INR  APTT  COMPREHENSIVE METABOLIC PANEL  CK TOTAL AND CKMB  TROPONIN I  I-STAT, CHEM 8  URINALYSIS, ROUTINE W REFLEX MICROSCOPIC  URINE CULTURE   Ct Head Wo Contrast  09/13/2011  *RADIOLOGY REPORT*  Clinical Data: Code stroke  CT HEAD WITHOUT CONTRAST  Technique:  Contiguous axial images were obtained from the base of the skull through the vertex without contrast.  Comparison: 10/22/2010  Findings: Postoperative encephalomalacia is seen  in the posterior right frontal lobe, the site of previous meningioma resection. Hypodensity in the high right parietal region was not  seen on the previous CT.  No evidence for acute hemorrhage, hydrocephalus, or abnormal extra-axial fluid collection.  No definite CT evidence for acute infarction.  The visualized paranasal sinuses and mastoid air cells are clear.  IMPRESSION: Hypodensity the high right parietal regions suggests acute to subacute ischemia.  No evidence for associated hemorrhage.  Postsurgical encephalomalacia in the posterior right frontal lobe.  I personally called these results to Dr. Freida Busman in the emergency department at 1552 hours on 09/13/2011.  Original Report Authenticated By: ERIC A. MANSELL, M.D.     No diagnosis found.    MDM  Patient was initially called a code stroke. However due to the timeframe of the outside of the stroke window it was canceled by the neurologist who has evaluated the patient. He recommends that the patient be seen by internal medicine service for admission   Date: 09/13/2011  Rate: 76  Rhythm: normal sinus rhythm  QRS Axis: normal  Intervals: normal  ST/T Wave abnormalities: nonspecific ST changes  Conduction Disutrbances:left anterior fascicular block  Narrative Interpretation:   Old EKG Reviewed: unchanged          Toy Baker, MD 09/13/11 [phone]

## 2011-09-13 NOTE — Code Documentation (Signed)
61 yo male who lives in a boarding house.  Went to bed last night around 2200 with a headache. Usually wakes up at 0400, but did not awaken until 1000. Upon awakening, he tried to reach his pants on the left side of the bed with his left hand and fell to the floor.  Unable to reach the door to call for help, it was several hours until someone found him. EMS was called and they activated Code stroke at 1513. The stroke team arrived at 1515. Phlebotomist arrived at 1519. The pt arrived at 1534 and was examined at that time by the EDP who cleared him for CT. Neurologist to CT at 1539. Pt returned to room 3; labs drawn; NIHSS is 12 (see document). Once LSN time was verfied, code stroke was canceled at 1542. Not eligible for tPA or any clinical trials due to outside timeframe.

## 2011-09-13 NOTE — Consult Note (Signed)
Referring Physician: Dr. Freida Busman (ED)    Chief Complaint: "left hemiparesis"  HPI: Willie Andersen is an 61 y.o. male who woke up this morning around 10:00 am and tried to put on his pants while getting out of bed, but he fell as he was getting out of bed and was on the ground for some time until he was found and brought to the hospital. Stroke code was cancelled as he was last seen normal the night before. He had left hemiparesis with left facial droop  LSN: 10:30 pm the day before tPA Given: No: out of window mRankin: 2  Past Medical History  Diagnosis Date  . Stroke   . Brain tumor    Past Surgical History  Procedure Date  . Brain tumor removed craniotomy   Family History  Problem Relation Age of Onset  . Cancer Neg Hx   . Stroke Neg Hx    Social History:  reports that he has been smoking Cigarettes.  He has been smoking about .5 packs per day. He does not have any smokeless tobacco history on file. He reports that he drinks alcohol. He reports that he does not use illicit drugs.  Allergies: No Known Allergies  Medications: I have reviewed the patient's current medications.  ROS: As above  Physical Examination: Blood pressure 132/88, pulse 73, temperature 97.9 F (36.6 C), temperature source Oral, resp. rate 14, height 5\' 8"  (1.727 m), weight 86.183 kg (190 lb), SpO2 99.00%.  Neurologic Examination: MS: AAO*1, no aphasia, left sided neglect CN: PERRL, right gaze preference with partial crossing of eyes across the midline, left facial droop Motor: left arm 2/5, left leg 2/5, right arm and leg 5/5 Sensory: no deficit to LT/pain throughout Coord: F to N intact on the R Reflexes: upgoing plantar on L, reflexes 1+ throughout Gait: deferred  Results for orders placed during the hospital encounter of 09/13/11 (from the past 48 hour(s))  PROTIME-INR     Status: Normal   Collection Time   09/13/11  3:31 PM      Component Value Range Comment   Prothrombin Time 12.0  11.6 -  15.2 (seconds)    INR 0.87  0.00 - 1.49    APTT     Status: Normal   Collection Time   09/13/11  3:31 PM      Component Value Range Comment   aPTT 25  24 - 37 (seconds)   CBC     Status: Abnormal   Collection Time   09/13/11  3:31 PM      Component Value Range Comment   WBC 10.6 (*) 4.0 - 10.5 (K/uL)    RBC 5.61  4.22 - 5.81 (MIL/uL)    Hemoglobin 17.8 (*) 13.0 - 17.0 (g/dL)    HCT 16.1  09.6 - 04.5 (%)    MCV 87.2  78.0 - 100.0 (fL)    MCH 31.7  26.0 - 34.0 (pg)    MCHC 36.4 (*) 30.0 - 36.0 (g/dL)    RDW 40.9  81.1 - 91.4 (%)    Platelets 260  150 - 400 (K/uL)   DIFFERENTIAL     Status: Normal   Collection Time   09/13/11  3:31 PM      Component Value Range Comment   Neutrophils Relative 71  43 - 77 (%)    Neutro Abs 7.5  1.7 - 7.7 (K/uL)    Lymphocytes Relative 22  12 - 46 (%)    Lymphs Abs 2.4  0.7 - 4.0 (K/uL)    Monocytes Relative 6  3 - 12 (%)    Monocytes Absolute 0.6  0.1 - 1.0 (K/uL)    Eosinophils Relative 0  0 - 5 (%)    Eosinophils Absolute 0.0  0.0 - 0.7 (K/uL)    Basophils Relative 0  0 - 1 (%)    Basophils Absolute 0.0  0.0 - 0.1 (K/uL)   COMPREHENSIVE METABOLIC PANEL     Status: Abnormal   Collection Time   09/13/11  3:31 PM      Component Value Range Comment   Sodium 132 (*) 135 - 145 (mEq/L)    Potassium 4.4  3.5 - 5.1 (mEq/L)    Chloride 96  96 - 112 (mEq/L)    CO2 23  19 - 32 (mEq/L)    Glucose, Bld 283 (*) 70 - 99 (mg/dL)    BUN 6  6 - 23 (mg/dL)    Creatinine, Ser 4.69  0.50 - 1.35 (mg/dL)    Calcium 62.9  8.4 - 10.5 (mg/dL)    Total Protein 8.4 (*) 6.0 - 8.3 (g/dL)    Albumin 4.2  3.5 - 5.2 (g/dL)    AST 23  0 - 37 (U/L)    ALT 23  0 - 53 (U/L)    Alkaline Phosphatase 93  39 - 117 (U/L)    Total Bilirubin 1.1  0.3 - 1.2 (mg/dL)    GFR calc non Af Amer 88 (*) >90 (mL/min)    GFR calc Af Amer >90  >90 (mL/min)   CK TOTAL AND CKMB     Status: Normal   Collection Time   09/13/11  3:31 PM      Component Value Range Comment   Total CK 166  7 - 232  (U/L)    CK, MB 2.4  0.3 - 4.0 (ng/mL)    Relative Index 1.4  0.0 - 2.5    TROPONIN I     Status: Normal   Collection Time   09/13/11  3:31 PM      Component Value Range Comment   Troponin I <0.30  <0.30 (ng/mL)   POCT I-STAT, CHEM 8     Status: Abnormal   Collection Time   09/13/11  3:39 PM      Component Value Range Comment   Sodium 136  135 - 145 (mEq/L)    Potassium 4.5  3.5 - 5.1 (mEq/L)    Chloride 101  96 - 112 (mEq/L)    BUN 5 (*) 6 - 23 (mg/dL)    Creatinine, Ser 5.28  0.50 - 1.35 (mg/dL)    Glucose, Bld 413 (*) 70 - 99 (mg/dL)    Calcium, Ion 2.44  1.12 - 1.32 (mmol/L)    TCO2 26  0 - 100 (mmol/L)    Hemoglobin 18.4 (*) 13.0 - 17.0 (g/dL)    HCT 01.0 (*) 27.2 - 52.0 (%)    Dg Chest 2 View  09/13/2011  *RADIOLOGY REPORT*  Clinical Data: Pain, weakness  CHEST - 2 VIEW  Comparison: 09/22/2010  Findings: Cardiomediastinal silhouette is stable.  No acute infiltrate or pleural effusion.  No pulmonary edema.  The bony thorax is stable.  Mild hyperinflation again noted.  IMPRESSION: No active disease.  No significant change.  Original Report Authenticated By: Natasha Mead, M.D.   Ct Head Wo Contrast  09/13/2011  *RADIOLOGY REPORT*  Clinical Data: Code stroke  CT HEAD WITHOUT CONTRAST  Technique:  Contiguous axial images were obtained  from the base of the skull through the vertex without contrast.  Comparison: 10/22/2010  Findings: Postoperative encephalomalacia is seen in the posterior right frontal lobe, the site of previous meningioma resection. Hypodensity in the high right parietal region was not  seen on the previous CT.  No evidence for acute hemorrhage, hydrocephalus, or abnormal extra-axial fluid collection.  No definite CT evidence for acute infarction.  The visualized paranasal sinuses and mastoid air cells are clear.  IMPRESSION: Hypodensity the high right parietal regions suggests acute to subacute ischemia.  No evidence for associated hemorrhage.  Postsurgical encephalomalacia in  the posterior right frontal lobe.  I personally called these results to Dr. Freida Busman in the emergency department at 1552 hours on 09/13/2011.  Original Report Authenticated By: ERIC A. MANSELL, M.D.   Assessment: 61 y.o. 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 HgbA1c, fasting lipid panel 2. MRI, MRA  of the brain without contrast 3. PT consult, OT consult, Speech consult 4. Echocardiogram 5. Carotid dopplers 6. Prophylactic therapy-Antiplatelet med: Aspirin - dose 300 pr or ASA 325 PO daily depending on whether he passes nursing swallow eval 7. Risk factor modification 8. NS at 75 cc/hr 9. Keep MAP b/w 120 to 130 10. Neurochecks q4h 11. Vital Signs q4h  Everline Mahaffy 09/13/2011, 5:15 PM

## 2011-09-13 NOTE — ED Notes (Signed)
Code Stroke called.
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
## 2011-09-13 NOTE — ED Notes (Signed)
Phlebotomist at bridge

## 2011-09-13 NOTE — ED Notes (Addendum)
Per EMS pt was last seen normal at 2230 last night. Woke up this morning at 1000 and got up out of bed and felt fine but stood up and felt off balance and then fell. Also c/o left field vision disturbances, left hand and foot weakness per EMS. Pt has a hx of previous CVA in 2011.

## 2011-09-13 NOTE — ED Notes (Signed)
Stroke Team arrival to bridge

## 2011-09-13 NOTE — ED Notes (Signed)
Patient was last seen normal

## 2011-09-13 NOTE — ED Notes (Signed)
CT read by Neurologist in CT Cancelled Code Stroke at 1542, pt brought to room 3 back in ER, Dr. Freida Busman in to see patient.

## 2011-09-13 NOTE — ED Notes (Signed)
Pt returned from Xray. Placed back on monitor. 

## 2011-09-13 NOTE — ED Notes (Signed)
[id number] Ready 

## 2011-09-13 NOTE — ED Notes (Signed)
Arrived in CT1

## 2011-09-13 NOTE — ED Notes (Signed)
Pt arrived by EMS to bridge, evaluated by EDP, and ordered to taken to CT.

## 2011-09-14 ENCOUNTER — Other Ambulatory Visit: Payer: Self-pay

## 2011-09-14 ENCOUNTER — Inpatient Hospital Stay (HOSPITAL_COMMUNITY): Payer: Medicaid Other

## 2011-09-14 LAB — LIPID PANEL
Cholesterol: 182 mg/dL (ref 0–200)
Triglycerides: 179 mg/dL — ABNORMAL HIGH (ref <150)
VLDL: 36 mg/dL (ref 0–40)

## 2011-09-14 LAB — GLUCOSE, CAPILLARY: Glucose-Capillary: 253 mg/dL — ABNORMAL HIGH (ref 70–99)

## 2011-09-14 MED ORDER — INSULIN ASPART 100 UNIT/ML ~~LOC~~ SOLN
0.0000 [IU] | SUBCUTANEOUS | Status: DC
  Administered 2011-09-14 (×2): 3 [IU] via SUBCUTANEOUS
  Administered 2011-09-14: 5 [IU] via SUBCUTANEOUS
  Administered 2011-09-15 (×2): 3 [IU] via SUBCUTANEOUS
  Administered 2011-09-15: 2 [IU] via SUBCUTANEOUS
  Administered 2011-09-15: 5 [IU] via SUBCUTANEOUS
  Administered 2011-09-15: 2 [IU] via SUBCUTANEOUS
  Administered 2011-09-15 – 2011-09-16 (×4): 3 [IU] via SUBCUTANEOUS
  Administered 2011-09-16 (×2): 2 [IU] via SUBCUTANEOUS
  Administered 2011-09-16: 3 [IU] via SUBCUTANEOUS
  Administered 2011-09-17: 2 [IU] via SUBCUTANEOUS
  Administered 2011-09-17: 3 [IU] via SUBCUTANEOUS
  Administered 2011-09-17: 2 [IU] via SUBCUTANEOUS
  Administered 2011-09-17: 3 [IU] via SUBCUTANEOUS
  Filled 2011-09-14: qty 3
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
## 2011-09-14 NOTE — Progress Notes (Signed)
Speech Language Pathology SLP Cancellation Note  Treatment cancelled today due to patient receiving procedure or test.  Will continue efforts. Nickalus Thornsberry B. Murvin Natal [facility], CCC-SLP [phone] pager

## 2011-09-14 NOTE — Plan of Care (Signed)
Problem: Phase II Progression Outcomes Goal: Tolerating diet Outcome: Progressing Willie Andersen, MSP, CCC-SLP [phone]        

## 2011-09-14 NOTE — H&P (Signed)
Internal Medicine Teaching Service Attending Note Date: 09/14/2011  Patient name: Willie Andersen  Medical record number: [id number]  Date of birth: [birth year]   I have seen and evaluated Anthony Sar and discussed their care with the Residency Team.   Physical Exam: Blood pressure 130/81, pulse 77, temperature 98.2 F (36.8 C), temperature source Oral, resp. rate 18, height 5\' 8"  (1.727 m), weight 188 lb 4.4 oz (85.4 kg), SpO2 96.00%.   Lab results: Results for orders placed during the hospital encounter of 09/13/11 (from the past 24 hour(s))  PROTIME-INR     Status: Normal   Collection Time   09/13/11  3:31 PM      Component Value Range   Prothrombin Time 12.0  11.6 - 15.2 (seconds)   INR 0.87  0.00 - 1.49   APTT     Status: Normal   Collection Time   09/13/11  3:31 PM      Component Value Range   aPTT 25  24 - 37 (seconds)  CBC     Status: Abnormal   Collection Time   09/13/11  3:31 PM      Component Value Range   WBC 10.6 (*) 4.0 - 10.5 (K/uL)   RBC 5.61  4.22 - 5.81 (MIL/uL)   Hemoglobin 17.8 (*) 13.0 - 17.0 (g/dL)   HCT 29.5  28.4 - 13.2 (%)   MCV 87.2  78.0 - 100.0 (fL)   MCH 31.7  26.0 - 34.0 (pg)   MCHC 36.4 (*) 30.0 - 36.0 (g/dL)   RDW 44.0  10.2 - 72.5 (%)   Platelets 260  150 - 400 (K/uL)  DIFFERENTIAL     Status: Normal   Collection Time   09/13/11  3:31 PM      Component Value Range   Neutrophils Relative 71  43 - 77 (%)   Neutro Abs 7.5  1.7 - 7.7 (K/uL)   Lymphocytes Relative 22  12 - 46 (%)   Lymphs Abs 2.4  0.7 - 4.0 (K/uL)   Monocytes Relative 6  3 - 12 (%)   Monocytes Absolute 0.6  0.1 - 1.0 (K/uL)   Eosinophils Relative 0  0 - 5 (%)   Eosinophils Absolute 0.0  0.0 - 0.7 (K/uL)   Basophils Relative 0  0 - 1 (%)   Basophils Absolute 0.0  0.0 - 0.1 (K/uL)  COMPREHENSIVE METABOLIC PANEL     Status: Abnormal   Collection Time   09/13/11  3:31 PM      Component Value Range   Sodium 132 (*) 135 - 145 (mEq/L)   Potassium 4.4  3.5 - 5.1 (mEq/L)   Chloride 96  96 - 112 (mEq/L)   CO2 23  19 - 32 (mEq/L)   Glucose, Bld 283 (*) 70 - 99 (mg/dL)   BUN 6  6 - 23 (mg/dL)   Creatinine, Ser 3.66  0.50 - 1.35 (mg/dL)   Calcium 44.0  8.4 - 10.5 (mg/dL)   Total Protein 8.4 (*) 6.0 - 8.3 (g/dL)   Albumin 4.2  3.5 - 5.2 (g/dL)   AST 23  0 - 37 (U/L)   ALT 23  0 - 53 (U/L)   Alkaline Phosphatase 93  39 - 117 (U/L)   Total Bilirubin 1.1  0.3 - 1.2 (mg/dL)   GFR calc non Af Amer 88 (*) >90 (mL/min)   GFR calc Af Amer >90  >90 (mL/min)  CK TOTAL AND CKMB     Status: Normal  Collection Time   09/13/11  3:31 PM      Component Value Range   Total CK 166  7 - 232 (U/L)   CK, MB 2.4  0.3 - 4.0 (ng/mL)   Relative Index 1.4  0.0 - 2.5   TROPONIN I     Status: Normal   Collection Time   09/13/11  3:31 PM      Component Value Range   Troponin I <0.30  <0.30 (ng/mL)  POCT I-STAT, CHEM 8     Status: Abnormal   Collection Time   09/13/11  3:39 PM      Component Value Range   Sodium 136  135 - 145 (mEq/L)   Potassium 4.5  3.5 - 5.1 (mEq/L)   Chloride 101  96 - 112 (mEq/L)   BUN 5 (*) 6 - 23 (mg/dL)   Creatinine, Ser 1.61  0.50 - 1.35 (mg/dL)   Glucose, Bld 096 (*) 70 - 99 (mg/dL)   Calcium, Ion 0.45  4.09 - 1.32 (mmol/L)   TCO2 26  0 - 100 (mmol/L)   Hemoglobin 18.4 (*) 13.0 - 17.0 (g/dL)   HCT 81.1 (*) 91.4 - 52.0 (%)  URINALYSIS, ROUTINE W REFLEX MICROSCOPIC     Status: Abnormal   Collection Time   09/13/11  5:57 PM      Component Value Range   Color, Urine YELLOW  YELLOW    APPearance CLEAR  CLEAR    Specific Gravity, Urine 1.036 (*) 1.005 - 1.030    pH 6.0  5.0 - 8.0    Glucose, UA >1000 (*) NEGATIVE (mg/dL)   Hgb urine dipstick NEGATIVE  NEGATIVE    Bilirubin Urine NEGATIVE  NEGATIVE    Ketones, ur 15 (*) NEGATIVE (mg/dL)   Protein, ur NEGATIVE  NEGATIVE (mg/dL)   Urobilinogen, UA 2.0 (*) 0.0 - 1.0 (mg/dL)   Nitrite NEGATIVE  NEGATIVE    Leukocytes, UA NEGATIVE  NEGATIVE   URINE MICROSCOPIC-ADD ON     Status: Normal   Collection  Time   09/13/11  5:57 PM      Component Value Range   Squamous Epithelial / LPF RARE  RARE   GLUCOSE, CAPILLARY     Status: Abnormal   Collection Time   09/13/11 10:06 PM      Component Value Range   Glucose-Capillary 339 (*) 70 - 99 (mg/dL)  LIPID PANEL     Status: Abnormal   Collection Time   09/14/11  6:35 AM      Component Value Range   Cholesterol 182  0 - 200 (mg/dL)   Triglycerides 782 (*) <150 (mg/dL)   HDL 39 (*) >95 (mg/dL)   Total CHOL/HDL Ratio 4.7     VLDL 36  0 - 40 (mg/dL)   LDL Cholesterol 621 (*) 0 - 99 (mg/dL)  GLUCOSE, CAPILLARY     Status: Abnormal   Collection Time   09/14/11  6:55 AM      Component Value Range   Glucose-Capillary 293 (*) 70 - 99 (mg/dL)   Comment 1 Notify RN    GLUCOSE, CAPILLARY     Status: Abnormal   Collection Time   09/14/11 11:41 AM      Component Value Range   Glucose-Capillary 253 (*) 70 - 99 (mg/dL)    Imaging results:  Dg Chest 2 View  09/13/2011  *RADIOLOGY REPORT*  Clinical Data: Pain, weakness  CHEST - 2 VIEW  Comparison: 09/22/2010  Findings: Cardiomediastinal silhouette is stable.  No  acute infiltrate or pleural effusion.  No pulmonary edema.  The bony thorax is stable.  Mild hyperinflation again noted.  IMPRESSION: No active disease.  No significant change.  Original Report Authenticated By: Natasha Mead, M.D.   Ct Head Wo Contrast  09/13/2011  *RADIOLOGY REPORT*  Clinical Data: Code stroke  CT HEAD WITHOUT CONTRAST  Technique:  Contiguous axial images were obtained from the base of the skull through the vertex without contrast.  Comparison: 10/22/2010  Findings: Postoperative encephalomalacia is seen in the posterior right frontal lobe, the site of previous meningioma resection. Hypodensity in the high right parietal region was not  seen on the previous CT.  No evidence for acute hemorrhage, hydrocephalus, or abnormal extra-axial fluid collection.  No definite CT evidence for acute infarction.  The visualized paranasal sinuses and  mastoid air cells are clear.  IMPRESSION: Hypodensity the high right parietal regions suggests acute to subacute ischemia.  No evidence for associated hemorrhage.  Postsurgical encephalomalacia in the posterior right frontal lobe.  I personally called these results to Dr. Freida Busman in the emergency department at 1552 hours on 09/13/2011.  Original Report Authenticated By: ERIC A. MANSELL, M.D.    Assessment and Plan: I agree with the formulated Assessment and Plan with the following changes: Mr. Agena has had right hemispheric ischemic stroke but was outside of thrombollytic intervention window. I agee with plans and eventually for secondardy stroke prophylaxis recommend aspirin plus dipyridamole since combination is better than aspirin alone.  Lina Sayre
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
## 2011-09-14 NOTE — Progress Notes (Signed)
Inpatient Diabetes Program Recommendations  AACE/ADA: New Consensus Statement on Inpatient Glycemic Control (2009)  Target Ranges:  Prepandial:   less than 140 mg/dL      Peak postprandial:   less than 180 mg/dL (1-2 hours)      Critically ill patients:  140 - 180 mg/dL   Reason:  Elevated glucose >200  Inpatient Diabetes Program Recommendations Correction (SSI): Start sensitive correction TID  HgbA1C: PENDING results Change diet to carb modified

## 2011-09-14 NOTE — Progress Notes (Signed)
Subjective: Patient feels the same with yesterday. No fever or chills. No chest pain or SOB.  Objective: Vital signs in last 24 hours: Filed Vitals:   09/14/11 0002 09/14/11 0240 09/14/11 0420 09/14/11 0636  BP: 150/98 139/88 138/88 122/81  Pulse: 68 72 66 72  Temp: 98.4 F (36.9 C) 98.6 F (37 C) 98.6 F (37 C) 98.6 F (37 C)  TempSrc: Oral Oral Oral Oral  Resp: 16 17 16 18   Height:      Weight:      SpO2: 97% 96% 97% 96%   Weight change:   Intake/Output Summary (Last 24 hours) at 09/14/11 0946 Last data filed at 09/13/11 2024  Gross per 24 hour  Intake    240 ml  Output      0 ml  Net    240 ml   Physical Exam: General: resting in bed, not in acute distress HEENT: PERRL, EOMI, no scleral icterus. His left eye has extra fat-like tissue in the lateral side of sclera. Cardiac: S1/S2, RRR, No murmurs, gallops or rubs Pulm: Good air movement bilaterally, Clear to auscultation bilaterally, No rales, wheezing, rhonchi or rubs. Abd: Soft,  nondistended, nontender, no rebound pain, no organomegaly, BS present Ext: No rashes or edema, 2+DP/PT pulse bilaterally Neuro:   Mental Status: Alert, oriented, thought content appropriate.  Speech fluent without evidence of aphasia.  Able to follow commands.  Cranial Nerves: II: visual fields grossly normal, pupils equal, round, reactive to light and accommodation III,IV, VI: ptosis not present, extraocular muscles extra-ocular motions intact bilaterally V,VII: smile asymmetric, left facial droopy. facial light touch sensation normal bilaterally VIII: hearing normal bilaterally IX,X: gag reflex present XI: decreased trapezius strength on the left.   XII: tongue deviated to the left    Motor: 5/5 on the right upper and lower extremities. 0/5 on the left upper extremity and 1/5 on the left lower extremity. Sensory: light touch intact throughout, bilaterally Deep Tendon Reflexes: 2+ right knee reflex and 3+ left knee reflex Babinski  sign: negative on both side.  Cerebellar: Normal finger-to-nose test with left hand.    Lab Results: Basic Metabolic Panel:  Lab 09/13/11 1610 09/13/11 1531  NA 136 132*  K 4.5 4.4  CL 101 96  CO2 -- 23  GLUCOSE 280* 283*  BUN 5* 6  CREATININE 1.10 0.93  CALCIUM -- 10.4  MG -- --  PHOS -- --   Liver Function Tests:  Lab 09/13/11 1531  AST 23  ALT 23  ALKPHOS 93  BILITOT 1.1  PROT 8.4*  ALBUMIN 4.2   CBC:  Lab 09/13/11 1539 09/13/11 1531  WBC -- 10.6*  NEUTROABS -- 7.5  HGB 18.4* 17.8*  HCT 54.0* 48.9  MCV -- 87.2  PLT -- 260   Cardiac Enzymes:  Lab 09/13/11 1531  CKTOTAL 166  CKMB 2.4  CKMBINDEX --  TROPONINI <0.30   CBG:  Lab 09/14/11 0655 09/13/11 2206  GLUCAP 293* 339*   Hemoglobin A1C: No results found for this basename: HGBA1C in the last 168 hours Fasting Lipid Panel:  Lab 09/14/11 0635  CHOL 182  HDL 39*  LDLCALC 107*  TRIG 179*  CHOLHDL 4.7  LDLDIRECT --   Coagulation:  Lab 09/13/11 1531  LABPROT 12.0  INR 0.87   Anemia Panel: No results found for this basename: VITAMINB12,FOLATE,FERRITIN,TIBC,IRON,RETICCTPCT in the last 168 hours Urine Drug Screen: Drugs of Abuse     Component Value Date/Time   LABOPIA POSITIVE* 09/22/2010 1632   COCAINSCRNUR NONE  DETECTED 09/22/2010 1632   LABBENZ NONE DETECTED 09/22/2010 1632   AMPHETMU NONE DETECTED 09/22/2010 1632   THCU NONE DETECTED 09/22/2010 1632   LABBARB  Value: NONE DETECTED        DRUG SCREEN FOR MEDICAL PURPOSES ONLY.  IF CONFIRMATION IS NEEDED FOR ANY PURPOSE, NOTIFY LAB WITHIN 5 DAYS.        LOWEST DETECTABLE LIMITS FOR URINE DRUG SCREEN Drug Class       Cutoff (ng/mL) Amphetamine      1000 Barbiturate      200 Benzodiazepine   200 Tricyclics       300 Opiates          300 Cocaine          300 THC              50 09/22/2010 1632     Micro Results: No results found for this or any previous visit (from the past 240 hour(s)). Studies/Results: Dg Chest 2 View  09/13/2011   *RADIOLOGY REPORT*  Clinical Data: Pain, weakness  CHEST - 2 VIEW  Comparison: 09/22/2010  Findings: Cardiomediastinal silhouette is stable.  No acute infiltrate or pleural effusion.  No pulmonary edema.  The bony thorax is stable.  Mild hyperinflation again noted.  IMPRESSION: No active disease.  No significant change.  Original Report Authenticated By: Natasha Mead, M.D.   Ct Head Wo Contrast  09/13/2011  *RADIOLOGY REPORT*  Clinical Data: Code stroke  CT HEAD WITHOUT CONTRAST  Technique:  Contiguous axial images were obtained from the base of the skull through the vertex without contrast.  Comparison: 10/22/2010  Findings: Postoperative encephalomalacia is seen in the posterior right frontal lobe, the site of previous meningioma resection. Hypodensity in the high right parietal region was not  seen on the previous CT.  No evidence for acute hemorrhage, hydrocephalus, or abnormal extra-axial fluid collection.  No definite CT evidence for acute infarction.  The visualized paranasal sinuses and mastoid air cells are clear.  IMPRESSION: Hypodensity the high right parietal regions suggests acute to subacute ischemia.  No evidence for associated hemorrhage.  Postsurgical encephalomalacia in the posterior right frontal lobe.  I personally called these results to Dr. Freida Busman in the emergency department at 1552 hours on 09/13/2011.  Original Report Authenticated By: ERIC A. MANSELL, M.D.   Medications:  Scheduled Meds:   . aspirin  300 mg Rectal Daily   Or  . aspirin  325 mg Oral Daily  . enoxaparin (LOVENOX) injection  40 mg Subcutaneous Q24H  . insulin aspart  0-9 Units Subcutaneous Q4H   Continuous Infusions:   . sodium chloride 75 mL/hr at 09/14/11 0600   PRN Meds:.acetaminophen, acetaminophen, ondansetron (ZOFRAN) IV, senna-docusate Assessment/Plan:   1) Acute right parietal CVA: patient's clinic picture is most consistent with stroke.  Head CT shows no hemorrhage. So ischemic stroke is the most  likely diagnosis. Differential diagnosis includes recurrent brain meningioma, given his history of meningioma surgery in 2012. But CT scan did not show the recurrent of brain tumor.   - Out of the window for tPA. -Stroke workup with carotid Dopplers - PT/ OT evaluation and rehabilitation. 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 Stable postop changes per CT scan.  3) DM-II: patient last A1c was 8.3. His glucose is >200. Patient has not been on any medications.   -will start SSI. -pending A1c  4) .History of  polysubstance abuse: Patient admits using cocaine.  -Social work consult.  4) DVT prophylaxis: Lovenox    LOS: 1 day   Lorretta Harp 09/14/2011, 9:46 AM

## 2011-09-14 NOTE — Progress Notes (Signed)
Carotid Doppler has been completed. Bilaterally no significant ICA stenosis with antegrade vertebral flow.  Farrel Demark, RDMS

## 2011-09-14 NOTE — Progress Notes (Signed)
Stroke Team Progress Note  HISTORY Willie Andersen is an 61 y.o. male who woke up 09/13/2011 around 10:00 am and tried to put on his pants while getting out of bed, but he fell as he was getting out of bed and was on the ground for some time until he was found and brought to the hospital. Stroke code was cancelled as he was last seen normal 09/12/2011 at 2200. He had left hemiparesis with left facial droop. He was not a tPA candidate secondary to presenting beyond the time window. He was admitted for further evaluation and treatment.  SUBJECTIVE His therapist is at the bedside. Overall he feels his condition is stable.  OBJECTIVE Most recent Vital Signs: Temp: 98.6 F (37 C) (01/22 0636) Temp src: Oral (01/22 0636) BP: 122/81 mmHg (01/22 0636) Pulse Rate: 72  (01/22 0636) Respiratory Rate: 18 O2 Saturation: 96%  CBG (last 3)  Basename 09/14/11 0655 09/13/11 2206  GLUCAP 293* 339*   Intake/Output from previous day: 01/21 0701 - 01/22 0700 In: 240 [P.O.:240] Out: -   IV Fluid Intake:     . sodium chloride 75 mL/hr at 09/14/11 0600   Medications    . aspirin  300 mg Rectal Daily   Or  . aspirin  325 mg Oral Daily  . enoxaparin (LOVENOX) injection  40 mg Subcutaneous Q24H  PRN acetaminophen, acetaminophen, ondansetron (ZOFRAN) IV, senna-docusate  Diet:  General thin liquids Activity:  Bedrest DVT Prophylaxis:  Lovenox 40 mg sq daily   Studies: CBC    Component Value Date/Time   WBC 10.6* 09/13/2011 1531   RBC 5.61 09/13/2011 1531   HGB 18.4* 09/13/2011 1539   HCT 54.0* 09/13/2011 1539   PLT 260 09/13/2011 1531   MCV 87.2 09/13/2011 1531   MCH 31.7 09/13/2011 1531   MCHC 36.4* 09/13/2011 1531   RDW 12.2 09/13/2011 1531   LYMPHSABS 2.4 09/13/2011 1531   MONOABS 0.6 09/13/2011 1531   EOSABS 0.0 09/13/2011 1531   BASOSABS 0.0 09/13/2011 1531   CMP    Component Value Date/Time   NA 136 09/13/2011 1539   K 4.5 09/13/2011 1539   CL 101 09/13/2011 1539   CO2 23 09/13/2011 1531   GLUCOSE 280* 09/13/2011 1539   BUN 5* 09/13/2011 1539   CREATININE 1.10 09/13/2011 1539   CALCIUM 10.4 09/13/2011 1531   PROT 8.4* 09/13/2011 1531   ALBUMIN 4.2 09/13/2011 1531   AST 23 09/13/2011 1531   ALT 23 09/13/2011 1531   ALKPHOS 93 09/13/2011 1531   BILITOT 1.1 09/13/2011 1531   GFRNONAA 88* 09/13/2011 1531   GFRAA >90 09/13/2011 1531   COAGS Lab Results  Component Value Date   INR 0.87 09/13/2011   INR 0.89 09/22/2010   INR 0.86 09/14/2010   Lipid Panel    Component Value Date/Time   CHOL  Value: 196        ATP III CLASSIFICATION:  <200     mg/dL   Desirable  161-096  mg/dL   Borderline High  >=045    mg/dL   High        11/29/8117 2046   TRIG 119 09/22/2010 2046   HDL 45 09/22/2010 2046   CHOLHDL 4.4 09/22/2010 2046   VLDL 24 09/22/2010 2046   LDLCALC  Value: 127        Total Cholesterol/HDL:CHD Risk Coronary Heart Disease Risk Table  Men   Women  1/2 Average Risk   3.4   3.3  Average Risk       5.0   4.4  2 X Average Risk   9.6   7.1  3 X Average Risk  23.4   11.0        Use the calculated Patient Ratio above and the CHD Risk Table to determine the patient's CHD Risk.        ATP III CLASSIFICATION (LDL):  <100     mg/dL   Optimal  161-096  mg/dL   Near or Above                    Optimal  130-159  mg/dL   Borderline  045-409  mg/dL   High  >811     mg/dL   Very High* 05/06/7828 2046   HgbA1C  Lab Results  Component Value Date   HGBA1C  Value: 8.3 (NOTE)                                                                       According to the ADA Clinical Practice Recommendations for 2011, when HbA1c is used as a screening test:   >=6.5%   Diagnostic of Diabetes Mellitus           (if abnormal result  is confirmed)  5.7-6.4%   Increased risk of developing Diabetes Mellitus  References:Diagnosis and Classification of Diabetes Mellitus,Diabetes Care,2011,34(Suppl 1):S62-S69 and Standards of Medical Care in         Diabetes - 2011,Diabetes Care,2011,34  (Suppl 1):S11-S61.*  09/22/2010   Urine Drug Screen    Component Value Date/Time   LABOPIA POSITIVE* 09/22/2010 1632   COCAINSCRNUR NONE DETECTED 09/22/2010 1632   LABBENZ NONE DETECTED 09/22/2010 1632   AMPHETMU NONE DETECTED 09/22/2010 1632   THCU NONE DETECTED 09/22/2010 1632   LABBARB  Value: NONE DETECTED        DRUG SCREEN FOR MEDICAL PURPOSES ONLY.  IF CONFIRMATION IS NEEDED FOR ANY PURPOSE, NOTIFY LAB WITHIN 5 DAYS.        LOWEST DETECTABLE LIMITS FOR URINE DRUG SCREEN Drug Class       Cutoff (ng/mL) Amphetamine      1000 Barbiturate      200 Benzodiazepine   200 Tricyclics       300 Opiates          300 Cocaine          300 THC              50 09/22/2010 1632    Alcohol Level    Component Value Date/Time   ETH  Value: <5        LOWEST DETECTABLE LIMIT FOR SERUM ALCOHOL IS 5 mg/dL FOR MEDICAL PURPOSES ONLY 09/22/2010 1633  Cardiac Enzymes neg UA neg   CT of the brain   Hypodensity the high right parietal regions suggests acute to subacute ischemia.  No evidence for associated hemorrhage.  Postsurgical encephalomalacia in the posterior right frontal lobe.   MRI of the brain  Not ordered   MRA of the brain  Not ordered   2D Echocardiogram  ordered   Carotid Doppler  ordered   CXR  No active disease.  No significant change.  EKG  normal sinus rhythm.   Physical Exam   Pleasant middle-aged African American gentleman sitting up in bed with support from the occupational therapist. He is leaning to the right side. He is afebrile. Head is nontraumatic. Abdomen soft nontender.cardiac exam no murmur or gallop. Lungs clear to auscultation. Neurological exam awake alert oriented to time place and person. Mild dysarthria. No aphasia. A right gaze preference left gaze palsy. He does not blink to threat on the left but does on the right. Left lower facial weakness. Dense left hemiplegia 0/5 strength with hypotonia. Purposeful antigravity strength on the right. Right plantar is downgoing left is upgoing.  Coordination  and gait cannot be tested.  ASSESSMENT Mr. Willie Andersen is a 61 y.o. male with a right brain stroke with left hemiparesis and neglect, secondary to likely embolc source, workup underway. On aspirin 325 mg orally every day for secondary stroke prevention.History of craniotomy for right frontal meningioma resection in the past  Stroke risk factors:  diabetes mellitus, hypertension, smoking and previous history of stroke  Hospital day # 1  TREATMENT/PLAN Check MRI and MRA. Follow up CD and 2D echo. OOB. Therapy evals. Continue aspirin 325 mg orally every day for secondary stroke prevention. Ongoing risk factor control. Dr. Pearlean Brownie discussed recommendations with Dr. Clyde Lundborg.Stroke team will follow  Joaquin Music, ANP-BC, GNP-BC [facility] Pager: [phone] 09/14/2011 7:48 AM  Dr. Delia Heady, Stroke Center Medical Director, has personally reviewed chart, pertinent data, examined the patient and developed the plan of care.

## 2011-09-15 DIAGNOSIS — I517 Cardiomegaly: Secondary | ICD-10-CM

## 2011-09-15 LAB — GLUCOSE, CAPILLARY
Glucose-Capillary: 193 mg/dL — ABNORMAL HIGH (ref 70–99)
Glucose-Capillary: 240 mg/dL — ABNORMAL HIGH (ref 70–99)
Glucose-Capillary: 188 mg/dL — ABNORMAL HIGH (ref 70–99)
Glucose-Capillary: 275 mg/dL — ABNORMAL HIGH (ref 70–99)

## 2011-09-15 MED ORDER — SIMVASTATIN 20 MG PO TABS
20.0000 mg | ORAL_TABLET | Freq: Every day | ORAL | Status: DC
  Administered 2011-09-16 – 2011-09-20 (×4): 20 mg via ORAL
  Filled 2011-09-15 (×6): qty 1

## 2011-09-15 MED ORDER — INSULIN GLARGINE 100 UNIT/ML ~~LOC~~ SOLN
8.0000 [IU] | Freq: Every day | SUBCUTANEOUS | Status: DC
  Administered 2011-09-15 – 2011-09-16 (×2): 8 [IU] via SUBCUTANEOUS
  Filled 2011-09-15: qty 3

## 2011-09-15 NOTE — Progress Notes (Signed)
Inpatient Diabetes Program Recommendations  AACE/ADA: New Consensus Statement on Inpatient Glycemic Control (2009)  Target Ranges:  Prepandial:   less than 140 mg/dL      Peak postprandial:   less than 180 mg/dL (1-2 hours)      Critically ill patients:  140 - 180 mg/dL   Reason for Visit: Results for AMEAR, STROJNY (MRN [id number]) as of 09/15/2011 11:59  Ref. Range 09/14/2011 17:12 09/14/2011 20:10 09/14/2011 23:59 09/15/2011 03:54 09/15/2011 08:11  Glucose-Capillary Latest Range: 70-99 mg/dL 914 (H) 782 (H) 956 (H) 207 (H) 193 (H)    Inpatient Diabetes Program Recommendations Insulin - Basal: Consider adding Lantus 15 units daily. Correction (SSI): Start sensitive correction TID  HgbA1C: A1C=10.9% indicating average CBG's 267 mg/dL.  Note: Will follow.

## 2011-09-15 NOTE — Progress Notes (Signed)
*  PRELIMINARY RESULTS* Echocardiogram 2D Echocardiogram has been performed.  Cathie Beams Deneen 09/15/2011, 12:49 PM

## 2011-09-15 NOTE — Progress Notes (Signed)
Physical Therapy Treatment Patient Details Name: Willie Andersen MRN: [id number] DOB: 1951-08-19 Today's Date: 09/15/2011  PT Assessment/Plan  PT - Assessment/Plan Comments on Treatment Session: Co-treatment with OT. Pt up in chair on arrival. RN reports 2 person pivot to chair with very little assistance by pt.  Pt very cooperative, however required frequent stimulation/repetition of conversation as pt with frequent eye-closing. PT Plan: Discharge plan remains appropriate;Frequency needs to be updated PT Frequency: Min 4X/week Recommendations for Other Services: Rehab consult Follow Up Recommendations: Inpatient Rehab Equipment Recommended: Defer to next venue PT Goals  Acute Rehab PT Goals PT Goal: Sit at [facility] Of Bed - Progress: Progressing toward goal Pt will go Sit to Stand: with upper extremity assist;with min assist PT Goal: Sit to Stand - Progress: Updated due to goal met Pt will go Stand to Sit: with min assist;with upper extremity assist PT Goal: Stand to Sit - Progress: Updated due to goals met Pt will Stand: with min assist;1 - 2 min PT Goal: Stand - Progress: Updated due to goal met  PT Treatment Precautions/Restrictions  Precautions Precautions: Fall Precaution Comments: Lt UE positioning to prevent subluxation- Pt currently in good aligment Required Braces or Orthoses: No Restrictions Weight Bearing Restrictions: No Mobility (including Balance) Transfers Sit to Stand: 1: +2 Total assist;Patient percentage (comment);With upper extremity assist;From chair/3-in-1 Sit to Stand Details (indicate cue type and reason): pt=60%; pt weight shifts over RUE and RLE to come to/from standing with minimal use of LLE; Lt knee supported by PT/OT; repeated x 2 Stand to Sit: 1: +2 Total assist;Patient percentage (comment);With upper extremity assist;To chair/3-in-1 Stand to Sit Details: pt=60%; pt weight shifts over RUE and RLE to come to/from standing with minimal use of LLE; Lt knee  supported by PT/OT; repeated x 2 Ambulation/Gait Ambulation/Gait: No  Posture/Postural Control Posture/Postural Control: Postural limitations Postural Limitations: Leans to Lt in standing due to Lt knee flexion/weakness; pt able to identify he is leaning, yet requires assist to weight-shift to Rt Balance Balance Assessed: Yes Static Sitting Balance Static Sitting - Balance Support: Feet supported;No upper extremity supported Static Sitting - Level of Assistance: 4: Min assist (minguard assist) Static Sitting - Comment/# of Minutes: sat edge of chair with minguard assist; pt self-selects to hold himself up with RUE, however able to balance with bil UEs relaxed in his lap Static Standing Balance Static Standing - Balance Support: Right upper extremity supported;Left upper extremity supported Static Standing - Level of Assistance: 1: +2 Total assist;Patient percentage (comment) Static Standing - Comment/# of Minutes: pt=60-65%; loses balance to Lt due to LLE weakness; able to activate Lt hip and knee extensors with max verbal and tactile cues for brief periods (1-2 seconds); pt spontaneously attempted to step with RLE with good activation of LLE in stance/extension Dynamic Standing Balance Dynamic Standing - Balance Support: Right upper extremity supported;Left upper extremity supported Dynamic Standing - Level of Assistance: 1: +2 Total assist;Patient percentage (comment) Dynamic Standing - Balance Activities: Lateral lean/weight shifting Dynamic Standing - Comments: pt=60-65% shifting to Rt and then activating LLE as wt-shifts back to his Lt; stood ~2 minutes x 2  Exercise  General Exercises - Lower Extremity Ankle Circles/Pumps: PROM;Left;5 reps;Seated (no AROM noted) Long Arc Quad: PROM;Left;5 reps;Seated (no AROM noted) End of Session PT - End of Session Equipment Utilized During Treatment: Gait belt Activity Tolerance: Patient limited by fatigue;Other (comment) (frequent eye-closing  and requires redirection to task) Patient left: in chair;with call bell in reach;with family/visitor present Nurse Communication: Mobility  status for transfers General Behavior During Session: Lethargic Cognition: Impaired Cognitive Impairment: Improved awareness of deficits and safety awareness  Jaylanni Eltringham 09/15/2011, 11:36 AM Pager [phone]

## 2011-09-15 NOTE — Progress Notes (Signed)
Subjective:  Patient feels the same with yesterday. No fever or chills. No chest pain or SOB.  Objective: Vital signs in last 24 hours: Filed Vitals:   09/14/11 0002 09/14/11 0240 09/14/11 0420 09/14/11 0636  BP: 150/98 139/88 138/88 122/81  Pulse: 68 72 66 72  Temp: 98.4 F (36.9 C) 98.6 F (37 C) 98.6 F (37 C) 98.6 F (37 C)  TempSrc: Oral Oral Oral Oral  Resp: 16 17 16 18   Height:      Weight:      SpO2: 97% 96% 97% 96%   Weight change:   Intake/Output Summary (Last 24 hours) at 09/15/11 1828 Last data filed at 09/15/11 1636  Gross per 24 hour  Intake 2855.42 ml  Output    777 ml  Net 2078.42 ml   Physical Exam: General: resting in bed, not in acute distress HEENT: PERRL, EOMI, no scleral icterus. His left eye has extra fat-like tissue in the lateral side of sclera. Cardiac: S1/S2, RRR, No murmurs, gallops or rubs Pulm: Good air movement bilaterally, Clear to auscultation bilaterally, No rales, wheezing, rhonchi or rubs. Abd: Soft,  nondistended, nontender, no rebound pain, no organomegaly, BS present Ext: No rashes or edema, 2+DP/PT pulse bilaterally Neuro:   Mental Status: Alert, oriented, thought content appropriate.  Speech fluent without evidence of aphasia.  Able to follow commands.  Cranial Nerves: II: visual fields grossly normal, pupils equal, round, reactive to light and accommodation III,IV, VI: ptosis not present, extraocular muscles extra-ocular motions intact bilaterally V,VII: smile asymmetric, left facial droopy. facial light touch sensation normal bilaterally VIII: hearing normal bilaterally IX,X: gag reflex present XI: decreased trapezius strength on the left.   XII: tongue deviated to the left    Motor: 5/5 on the right upper and lower extremities. 0/5 on the left upper extremity and 1/5 on the left lower extremity. Sensory: light touch intact throughout, bilaterally Deep Tendon Reflexes: 2+ right knee reflex and 3+ left knee reflex Babinski  sign: negative on both side.  Cerebellar: Normal finger-to-nose test with left hand.    Lab Results: Basic Metabolic Panel:  Lab 09/13/11 1610 09/13/11 1531  NA 136 132*  K 4.5 4.4  CL 101 96  CO2 -- 23  GLUCOSE 280* 283*  BUN 5* 6  CREATININE 1.10 0.93  CALCIUM -- 10.4  MG -- --  PHOS -- --   Liver Function Tests:  Lab 09/13/11 1531  AST 23  ALT 23  ALKPHOS 93  BILITOT 1.1  PROT 8.4*  ALBUMIN 4.2   CBC:  Lab 09/13/11 1539 09/13/11 1531  WBC -- 10.6*  NEUTROABS -- 7.5  HGB 18.4* 17.8*  HCT 54.0* 48.9  MCV -- 87.2  PLT -- 260   Cardiac Enzymes:  Lab 09/13/11 1531  CKTOTAL 166  CKMB 2.4  CKMBINDEX --  TROPONINI <0.30   CBG:  Lab 09/15/11 1648 09/15/11 1203 09/15/11 0811 09/15/11 0354 09/14/11 2359 09/14/11 2010  GLUCAP 188* 240* 193* 207* 248* 237*   Hemoglobin A1C:  Lab 09/14/11 0635  HGBA1C 10.9*   Fasting Lipid Panel:  Lab 09/14/11 0635  CHOL 182  HDL 39*  LDLCALC 107*  TRIG 179*  CHOLHDL 4.7  LDLDIRECT --   Coagulation:  Lab 09/13/11 1531  LABPROT 12.0  INR 0.87   Anemia Panel: No results found for this basename: VITAMINB12,FOLATE,FERRITIN,TIBC,IRON,RETICCTPCT in the last 168 hours Urine Drug Screen: Drugs of Abuse     Component Value Date/Time   LABOPIA POSITIVE* 09/22/2010 1632  COCAINSCRNUR NONE DETECTED 09/22/2010 1632   LABBENZ NONE DETECTED 09/22/2010 1632   AMPHETMU NONE DETECTED 09/22/2010 1632   THCU NONE DETECTED 09/22/2010 1632   LABBARB  Value: NONE DETECTED        DRUG SCREEN FOR MEDICAL PURPOSES ONLY.  IF CONFIRMATION IS NEEDED FOR ANY PURPOSE, NOTIFY LAB WITHIN 5 DAYS.        LOWEST DETECTABLE LIMITS FOR URINE DRUG SCREEN Drug Class       Cutoff (ng/mL) Amphetamine      1000 Barbiturate      200 Benzodiazepine   200 Tricyclics       300 Opiates          300 Cocaine          300 THC              50 09/22/2010 1632     Micro Results: Recent Results (from the past 240 hour(s))  URINE CULTURE     Status: Normal    Collection Time   09/13/11  5:57 PM      Component Value Range Status Comment   Specimen Description URINE, RANDOM   Final    Special Requests NONE   Final    Setup Time [id number]   Final    Colony Count >=100,000 COLONIES/ML   Final    Culture     Final    Value: Multiple bacterial morphotypes present, none predominant. Suggest appropriate recollection if clinically indicated.   Report Status 09/14/2011 FINAL   Final    Studies/Results: Dg Chest 2 View  09/13/2011  *RADIOLOGY REPORT*  Clinical Data: Pain, weakness  CHEST - 2 VIEW  Comparison: 09/22/2010  Findings: Cardiomediastinal silhouette is stable.  No acute infiltrate or pleural effusion.  No pulmonary edema.  The bony thorax is stable.  Mild hyperinflation again noted.  IMPRESSION: No active disease.  No significant change.  Original Report Authenticated By: Natasha Mead, M.D.   Ct Head Wo Contrast  09/13/2011  *RADIOLOGY REPORT*  Clinical Data: Code stroke  CT HEAD WITHOUT CONTRAST  Technique:  Contiguous axial images were obtained from the base of the skull through the vertex without contrast.  Comparison: 10/22/2010  Findings: Postoperative encephalomalacia is seen in the posterior right frontal lobe, the site of previous meningioma resection. Hypodensity in the high right parietal region was not  seen on the previous CT.  No evidence for acute hemorrhage, hydrocephalus, or abnormal extra-axial fluid collection.  No definite CT evidence for acute infarction.  The visualized paranasal sinuses and mastoid air cells are clear.  IMPRESSION: Hypodensity the high right parietal regions suggests acute to subacute ischemia.  No evidence for associated hemorrhage.  Postsurgical encephalomalacia in the posterior right frontal lobe.  I personally called these results to Dr. Freida Busman in the emergency department at 1552 hours on 09/13/2011.  Original Report Authenticated By: ERIC A. MANSELL, M.D.   .mr  Medications:  Scheduled Meds:    . aspirin   300 mg Rectal Daily   Or  . aspirin  325 mg Oral Daily  . enoxaparin (LOVENOX) injection  40 mg Subcutaneous Q24H  . insulin aspart  0-9 Units Subcutaneous Q4H   Continuous Infusions:    . sodium chloride 125 mL/hr at 09/15/11 1730   PRN Meds:.acetaminophen, acetaminophen, ondansetron (ZOFRAN) IV, senna-docusate Assessment/Plan:   1) Acute right parietal CVA: patient's clinic picture is most consistent with stroke.  Head CT shows no hemorrhage. So ischemic stroke is the most likely diagnosis. Differential diagnosis  includes recurrent brain meningioma, given his history of meningioma surgery in 2012. But CT scan did not show the recurrent of brain tumor. Carotid Doppler shows No significant extracranial carotid artery stenosis demonstrated. Vertebrals are patent with antegrade flow. MRI: Acute right middle cerebral artery infarct. Postsurgical changes on the right with encephalomalacia in the right lateral temporal lobe. 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 There is flow distal to the stenosis.  - Out of the window for tPA. - PT/ OT evaluation and rehabilitation. - continue aspirin for secondary prevention of stroke. Will consider to add on dipyridamole.   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 Stable postop changes per CT scan.  3) DM-II: patient last A1c was 8.3. Patient has not been on any medications.   -will start SSI. -will start lantus 8 U daily  4) .History of polysubstance abuse: Patient admits using cocaine.  -Social work consult.  5.) disposition: patient agrees to go to SNF on discharge. SW is working on this.  6) DVT prophylaxis: Lovenox    LOS: 2 days   Lorretta Harp 09/15/2011, 6:28 PM

## 2011-09-15 NOTE — Progress Notes (Signed)
Internal Medicine Teaching Service Attending Note Date: 09/15/2011  Patient name: Willie Andersen  Medical record number: [id number]  Date of birth: [birth year]    This patient has been seen and discussed with the house staff. Please see their note for complete details. I concur with their findings with the following additions/corrections:Progressing through stroke protocol. Recommend ASA+dipyridamole trial as secondary prophylaxis since superior to ASA alone. Only problem is high rate of HA with dipyridamole but worth a try while here.  Lina Sayre 09/15/2011, 4:51 PM

## 2011-09-15 NOTE — Progress Notes (Signed)
Stroke Team Progress Note  HISTORY Willie Andersen is an 61 y.o. male who woke up 09/13/2011 around 10:00 am and tried to put on his pants while getting out of bed, but he fell as he was getting out of bed and was on the ground for some time until he was found and brought to the hospital. Stroke code was cancelled as he was last seen normal 09/12/2011 at 2200. He had left hemiparesis with left facial droop. He was not a tPA candidate secondary to presenting beyond the time window. He was admitted for further evaluation and treatment.  SUBJECTIVE His 2 sisters and daughter are the bedside. Overall he feels his condition is stable. He wants to go home, family is more reasonable.  OBJECTIVE Most recent Vital Signs: Temp: 99 F (37.2 C) (01/23 0600) BP: 135/83 mmHg (01/23 0600) Pulse Rate: 73  (01/23 0600) Respiratory Rate: 16 O2 Saturation: 98%  CBG (last 3) Basename 09/15/11 0354 09/14/11 2359 09/14/11 2010  GLUCAP 207* 248* 237*   Intake/Output from previous day: 01/22 0701 - 01/23 0700 In: 1980 [P.O.:480; I.V.:1500] Out: 1000 [Urine:1000]  IV Fluid Intake:     . sodium chloride 125 mL/hr at 09/14/11 1032   Medications    . aspirin  300 mg Rectal Daily   Or  . aspirin  325 mg Oral Daily  . enoxaparin (LOVENOX) injection  40 mg Subcutaneous Q24H  . insulin aspart  0-9 Units Subcutaneous Q4H  PRN acetaminophen, acetaminophen, ondansetron (ZOFRAN) IV, senna-docusate  Diet:  Dysphagia thin liquids Activity:  Bedrest DVT Prophylaxis:  Lovenox 40 mg sq daily   Studies: CBC    Component Value Date/Time   WBC 10.6* 09/13/2011 1531   RBC 5.61 09/13/2011 1531   HGB 18.4* 09/13/2011 1539   HCT 54.0* 09/13/2011 1539   PLT 260 09/13/2011 1531   MCV 87.2 09/13/2011 1531   MCH 31.7 09/13/2011 1531   MCHC 36.4* 09/13/2011 1531   RDW 12.2 09/13/2011 1531   LYMPHSABS 2.4 09/13/2011 1531   MONOABS 0.6 09/13/2011 1531   EOSABS 0.0 09/13/2011 1531   BASOSABS 0.0 09/13/2011 1531   CMP      Component Value Date/Time   NA 136 09/13/2011 1539   K 4.5 09/13/2011 1539   CL 101 09/13/2011 1539   CO2 23 09/13/2011 1531   GLUCOSE 280* 09/13/2011 1539   BUN 5* 09/13/2011 1539   CREATININE 1.10 09/13/2011 1539   CALCIUM 10.4 09/13/2011 1531   PROT 8.4* 09/13/2011 1531   ALBUMIN 4.2 09/13/2011 1531   AST 23 09/13/2011 1531   ALT 23 09/13/2011 1531   ALKPHOS 93 09/13/2011 1531   BILITOT 1.1 09/13/2011 1531   GFRNONAA 88* 09/13/2011 1531   GFRAA >90 09/13/2011 1531   COAGS Lab Results  Component Value Date   INR 0.87 09/13/2011   INR 0.89 09/22/2010   INR 0.86 09/14/2010   Lipid Panel    Component Value Date/Time   CHOL 182 09/14/2011 0635   TRIG 179* 09/14/2011 0635   HDL 39* 09/14/2011 0635   CHOLHDL 4.7 09/14/2011 0635   VLDL 36 09/14/2011 0635   LDLCALC 107* 09/14/2011 0635   HgbA1C  Lab Results  Component Value Date   HGBA1C 10.9* 09/14/2011   Urine Drug Screen    Component Value Date/Time   LABOPIA POSITIVE* 09/22/2010 1632   COCAINSCRNUR NONE DETECTED 09/22/2010 1632   LABBENZ NONE DETECTED 09/22/2010 1632   AMPHETMU NONE DETECTED 09/22/2010 1632   THCU NONE DETECTED 09/22/2010  1632   LABBARB  Value: NONE DETECTED        DRUG SCREEN FOR MEDICAL PURPOSES ONLY.  IF CONFIRMATION IS NEEDED FOR ANY PURPOSE, NOTIFY LAB WITHIN 5 DAYS.        LOWEST DETECTABLE LIMITS FOR URINE DRUG SCREEN Drug Class       Cutoff (ng/mL) Amphetamine      1000 Barbiturate      200 Benzodiazepine   200 Tricyclics       300 Opiates          300 Cocaine          300 THC              50 09/22/2010 1632    Alcohol Level    Component Value Date/Time   ETH  Value: <5        LOWEST DETECTABLE LIMIT FOR SERUM ALCOHOL IS 5 mg/dL FOR MEDICAL PURPOSES ONLY 09/22/2010 1633  Cardiac Enzymes neg UA neg   CT of the brain   Hypodensity the high right parietal regions suggests acute to subacute ischemia.  No evidence for associated hemorrhage.  Postsurgical encephalomalacia in the posterior right frontal lobe.   MRI of the  brain  Acute right middle cerebral artery infarct.  Postsurgical changes on the right with encephalomalacia in the right lateral temporal lobe.   MRA of the brain  severe stenosis of the right MCA bifurcation corresponding to the acute area of infarct.  There is flow distal to the stenosis.   2D Echocardiogram  ordered   Carotid Doppler  No internal carotid artery stenosis bilaterally. Vertebrals with antegrade flow bilaterally.   CXR  No active disease.  No significant change.  EKG  normal sinus rhythm.   Physical Exam   Pleasant middle-aged African American gentleman sitting up in bed with support from the occupational therapist. He is leaning to the right side. He is afebrile. Head is nontraumatic. Abdomen soft nontender.cardiac exam no murmur or gallop. Lungs clear to auscultation. Neurological exam awake alert oriented to time place and person. Mild dysarthria. No aphasia. A right gaze preference left gaze palsy. He does not blink to threat on the left but does on the right. Left lower facial weakness. Dense left hemiplegia 0/5 strength with hypotonia. Purposeful antigravity strength on the right. Right plantar is downgoing left is upgoing.  Coordination and gait cannot be tested.  ASSESSMENT Mr. JAMEEL QUANT is a 61 y.o. 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 Has left hemiplegia and neglect. On aspirin 325 mg orally every day for secondary stroke prevention. History of craniotomy for right frontal meningioma resection in the past. Physical therapy recommends in patient rehab consult.  Diabetes. New diagnosis. Needs diabetic meds/education.  Stroke risk factors:  diabetes mellitus, hypertension, smoking and previous history of stroke  Hospital day # 2  TREATMENT/PLAN Follow up 2D echo. Needs TEE to further rule out embolic source. OT  evaluation. Continue aspirin 325 mg orally every day for  secondary stroke prevention. Ongoing risk factor control. Dr. Pearlean Brownie discussed recommendations with Dr. Clyde Lundborg.  Joaquin Music, ANP-BC, GNP-BC [facility] Pager: [phone] 09/15/2011 8:08 AM  Dr. Delia Heady, Stroke Center Medical Director, has personally reviewed chart, pertinent data, examined the patient and developed the plan of care.
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
## 2011-09-16 LAB — GLUCOSE, CAPILLARY
Glucose-Capillary: 162 mg/dL — ABNORMAL HIGH (ref 70–99)
Glucose-Capillary: 208 mg/dL — ABNORMAL HIGH (ref 70–99)
Glucose-Capillary: 212 mg/dL — ABNORMAL HIGH (ref 70–99)
Glucose-Capillary: 231 mg/dL — ABNORMAL HIGH (ref 70–99)
Glucose-Capillary: 241 mg/dL — ABNORMAL HIGH (ref 70–99)
Glucose-Capillary: 266 mg/dL — ABNORMAL HIGH (ref 70–99)

## 2011-09-16 LAB — BASIC METABOLIC PANEL
CO2: 22 mEq/L (ref 19–32)
Calcium: 8.8 mg/dL (ref 8.4–10.5)
Chloride: 105 mEq/L (ref 96–112)
Potassium: 3.9 mEq/L (ref 3.5–5.1)
Sodium: 136 mEq/L (ref 135–145)

## 2011-09-16 LAB — CBC
Platelets: 213 10*3/uL (ref 150–400)
RBC: 4.69 MIL/uL (ref 4.22–5.81)
WBC: 5.8 10*3/uL (ref 4.0–10.5)

## 2011-09-16 MED ORDER — ASPIRIN-DIPYRIDAMOLE ER 25-200 MG PO CP12
1.0000 | ORAL_CAPSULE | Freq: Two times a day (BID) | ORAL | Status: DC
  Administered 2011-09-16 – 2011-09-21 (×10): 1 via ORAL
  Filled 2011-09-16 (×11): qty 1

## 2011-09-16 MED ORDER — INSULIN GLARGINE 100 UNIT/ML ~~LOC~~ SOLN
12.0000 [IU] | Freq: Every day | SUBCUTANEOUS | Status: DC
Start: 2011-09-17 — End: 2011-09-21
  Administered 2011-09-17 – 2011-09-21 (×5): 12 [IU] via SUBCUTANEOUS

## 2011-09-16 NOTE — Progress Notes (Signed)
Speech Pathology: Dysphagia Treatment Note  Patient was observed with : Mechanical Soft / Ground and Thin liquids.  Patient was noted to have s/s of aspiration : No  Lung Sounds:  Clear, diminished  Temperature:  98.3  Patient required: mod-max verbal and visual demonstration cues to consistently follow precautions/strategies  Clinical Impression: SLP provided po trials to assess for diet tolerance and encourage use of compensatory strategies to decrease aspiration risk s/p bedside swallowing assessment. Patient without overt s/s of aspiration with po trials provided above, however continues to present with a primary oral phase dysphagia, requiring mod-max verbal and visual demonstration cues to utilize lingual sweep to clear left sided pocketing and residue which patient has no awareness of.  Educated patient on importance of oral clearance for oral hygiene and safety of swallow. Given persistent oral phase deficits, current diet remains most appropriate.   Recommendations:  1. Continue current diet and aspiration precautions 2. SLP will continue to f/u for diet tolerance and education regarding use of aspiration precautions and compensatory strategies.   Pain:   none Intervention Required:   No   Goals: Progressing  Ferdinand Lango MA, CCC-SLP [phone]
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
## 2011-09-16 NOTE — Progress Notes (Signed)
Subjective:  Patient feels fine except for mild headache. Patient is much more interactive compared to yesterday. No fever or chills. No chest pain or SOB. His paralysis has not improved.  Objective: Vital signs in last 24 hours: Filed Vitals:   09/14/11 0002 09/14/11 0240 09/14/11 0420 09/14/11 0636  BP: 150/98 139/88 138/88 122/81  Pulse: 68 72 66 72  Temp: 98.4 F (36.9 C) 98.6 F (37 C) 98.6 F (37 C) 98.6 F (37 C)  TempSrc: Oral Oral Oral Oral  Resp: 16 17 16 18   Height:      Weight:      SpO2: 97% 96% 97% 96%   Weight change:   Intake/Output Summary (Last 24 hours) at 09/16/11 1622 Last data filed at 09/16/11 1400  Gross per 24 hour  Intake 2975.42 ml  Output   3177 ml  Net -201.58 ml   Physical Exam: General: resting in bed, not in acute distress HEENT: PERRL, EOMI, no scleral icterus. His left eye has extra fat-like tissue in the lateral side of sclera. Cardiac: S1/S2, RRR, No murmurs, gallops or rubs Pulm: Good air movement bilaterally, Clear to auscultation bilaterally, No rales, wheezing, rhonchi or rubs. Abd: Soft,  nondistended, nontender, no rebound pain, no organomegaly, BS present Ext: No rashes or edema, 2+DP/PT pulse bilaterally Neuro:   Mental Status: Alert, oriented, thought content appropriate.  Speech fluent without evidence of aphasia.  Able to follow commands.  Cranial Nerves: II: visual fields grossly normal, pupils equal, round, reactive to light and accommodation III,IV, VI: ptosis not present, extraocular muscles extra-ocular motions intact bilaterally V,VII: smile asymmetric, left facial droopy. facial light touch sensation normal bilaterally VIII: hearing normal bilaterally IX,X: gag reflex present XI: decreased trapezius strength on the left.   XII: tongue deviated to the left    Motor: 5/5 on the right upper and lower extremities. 0/5 on the left upper extremity and 1/5 on the left lower extremity. Sensory: light touch intact  throughout, bilaterally Deep Tendon Reflexes: 2+ right knee reflex and 3+ left knee reflex Babinski sign: negative on both side.  Cerebellar: Normal finger-to-nose test with left hand.    Lab Results: Basic Metabolic Panel:  Basic Metabolic Panel:    Component Value Date/Time   NA 136 09/16/2011 0617   K 3.9 09/16/2011 0617   CL 105 09/16/2011 0617   CO2 22 09/16/2011 0617   BUN 5* 09/16/2011 0617   CREATININE 0.77 09/16/2011 0617   GLUCOSE 165* 09/16/2011 0617   CALCIUM 8.8 09/16/2011 0617     Lab 09/13/11 1539 09/13/11 1531  NA 136 132*  K 4.5 4.4  CL 101 96  CO2 -- 23  GLUCOSE 280* 283*  BUN 5* 6  CREATININE 1.10 0.93  CALCIUM -- 10.4  MG -- --  PHOS -- --   Liver Function Tests:  Lab 09/13/11 1531  AST 23  ALT 23  ALKPHOS 93  BILITOT 1.1  PROT 8.4*  ALBUMIN 4.2   CBC:  CBC:    Component Value Date/Time   WBC 5.8 09/16/2011 0617   HGB 14.6 09/16/2011 0617   HCT 41.1 09/16/2011 0617   PLT 213 09/16/2011 0617   MCV 87.6 09/16/2011 0617   NEUTROABS 7.5 09/13/2011 1531   LYMPHSABS 2.4 09/13/2011 1531   MONOABS 0.6 09/13/2011 1531   EOSABS 0.0 09/13/2011 1531   BASOSABS 0.0 09/13/2011 1531       Lab 09/16/11 0617 09/13/11 1539 09/13/11 1531  WBC 5.8 -- 10.6*  NEUTROABS -- --  7.5  HGB 14.6 18.4* --  HCT 41.1 54.0* --  MCV 87.6 -- 87.2  PLT 213 -- 260   Cardiac Enzymes:  Lab 09/13/11 1531  CKTOTAL 166  CKMB 2.4  CKMBINDEX --  TROPONINI <0.30   CBG:  Lab 09/16/11 1100 09/16/11 0754 09/16/11 0413 09/15/11 2345 09/15/11 2027 09/15/11 2002  GLUCAP 231* 168* 162* 208* 275* 266*   Hemoglobin A1C:  Lab 09/14/11 0635  HGBA1C 10.9*   Fasting Lipid Panel:  Lab 09/14/11 0635  CHOL 182  HDL 39*  LDLCALC 107*  TRIG 179*  CHOLHDL 4.7  LDLDIRECT --   Coagulation:  Lab 09/13/11 1531  LABPROT 12.0  INR 0.87    Drugs of Abuse     Component Value Date/Time   LABOPIA POSITIVE* 09/22/2010 1632   COCAINSCRNUR NONE DETECTED 09/22/2010 1632   LABBENZ  NONE DETECTED 09/22/2010 1632   AMPHETMU NONE DETECTED 09/22/2010 1632   THCU NONE DETECTED 09/22/2010 1632   LABBARB  Value: NONE DETECTED        DRUG SCREEN FOR MEDICAL PURPOSES ONLY.  IF CONFIRMATION IS NEEDED FOR ANY PURPOSE, NOTIFY LAB WITHIN 5 DAYS.        LOWEST DETECTABLE LIMITS FOR URINE DRUG SCREEN Drug Class       Cutoff (ng/mL) Amphetamine      1000 Barbiturate      200 Benzodiazepine   200 Tricyclics       300 Opiates          300 Cocaine          300 THC              50 09/22/2010 1632     Micro Results: Recent Results (from the past 240 hour(s))  URINE CULTURE     Status: Normal   Collection Time   09/13/11  5:57 PM      Component Value Range Status Comment   Specimen Description URINE, RANDOM   Final    Special Requests NONE   Final    Setup Time [id number]   Final    Colony Count >=100,000 COLONIES/ML   Final    Culture     Final    Value: Multiple bacterial morphotypes present, none predominant. Suggest appropriate recollection if clinically indicated.   Report Status 09/14/2011 FINAL   Final    Studies/Results: Dg Chest 2 View  09/13/2011  *RADIOLOGY REPORT*  Clinical Data: Pain, weakness  CHEST - 2 VIEW  Comparison: 09/22/2010  Findings: Cardiomediastinal silhouette is stable.  No acute infiltrate or pleural effusion.  No pulmonary edema.  The bony thorax is stable.  Mild hyperinflation again noted.  IMPRESSION: No active disease.  No significant change.  Original Report Authenticated By: Natasha Mead, M.D.   Ct Head Wo Contrast  09/13/2011  *RADIOLOGY REPORT*  Clinical Data: Code stroke  CT HEAD WITHOUT CONTRAST  Technique:  Contiguous axial images were obtained from the base of the skull through the vertex without contrast.  Comparison: 10/22/2010  Findings: Postoperative encephalomalacia is seen in the posterior right frontal lobe, the site of previous meningioma resection. Hypodensity in the high right parietal region was not  seen on the previous CT.  No evidence for acute  hemorrhage, hydrocephalus, or abnormal extra-axial fluid collection.  No definite CT evidence for acute infarction.  The visualized paranasal sinuses and mastoid air cells are clear.  IMPRESSION: Hypodensity the high right parietal regions suggests acute to subacute ischemia.  No evidence for associated hemorrhage.  Postsurgical  encephalomalacia in the posterior right frontal lobe.  I personally called these results to Dr. Freida Busman in the emergency department at 1552 hours on 09/13/2011.  Original Report Authenticated By: ERIC A. MANSELL, M.D.   Medications:  Scheduled Meds:    . dipyridamole-aspirin  1 capsule Oral BID  . enoxaparin (LOVENOX) injection  40 mg Subcutaneous Q24H  . insulin aspart  0-9 Units Subcutaneous Q4H  . insulin glargine  8 Units Subcutaneous Daily  . simvastatin  20 mg Oral q1800  . DISCONTD: aspirin  300 mg Rectal Daily  . DISCONTD: aspirin  325 mg Oral Daily   Continuous Infusions:    . DISCONTD: sodium chloride 125 mL/hr at 09/16/11 0659   PRN Meds:.acetaminophen, acetaminophen, ondansetron (ZOFRAN) IV, senna-docusate Assessment/Plan:   1) Acute right parietal CVA: patient's clinic picture is most consistent with stroke.  Head CT shows no hemorrhage. So ischemic stroke is the most likely diagnosis. Differential diagnosis includes recurrent brain meningioma, given his history of meningioma surgery in 2012. But CT scan did not show the recurrent of brain tumor. Carotid Doppler shows No significant extracranial carotid artery stenosis demonstrated. Vertebrals are patent with antegrade flow. MRI: Acute right middle cerebral artery infarct. Postsurgical changes on the right with encephalomalacia in the right lateral temporal lobe. 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 There is flow distal to the stenosis. Out of the window for tPA.  - Appreciate Neurology's help in managing our patient. Will follow Dr. Marlis Edelson  suggestion to get TEE today. -will change ASA to Aggrenox for secondary prevention of stroke - PT/ OT evaluation and rehabilitation.  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 Stable postop changes per CT scan.  3) DM-II: patient A1c was 10.9 on this admission. Patient has not been on any medications at home. His glucose is still high today.   -will increase lantus to 12 U daily -will continue  SSI.  4) .History of polysubstance abuse: Patient admits using cocaine.  -Social work consult.  5.) disposition: patient agrees to go to SNF on discharge. SW is working on this. Will talk to his daughter about disposition.  6) DVT prophylaxis: Lovenox    LOS: 3 days   Lorretta Harp 09/16/2011, 4:22 PM

## 2011-09-16 NOTE — Progress Notes (Signed)
Inpatient Diabetes Program Recommendations  AACE/ADA: New Consensus Statement on Inpatient Glycemic Control (2009)  Target Ranges:  Prepandial:   less than 140 mg/dL      Peak postprandial:   less than 180 mg/dL (1-2 hours)      Critically ill patients:  140 - 180 mg/dL   Reason for Visit:Results for PRANAY, HILBUN (MRN [id number]) as of 09/16/2011 12:00  Ref. Range 09/15/2011 20:27 09/15/2011 23:45 09/16/2011 04:13 09/16/2011 07:54 09/16/2011 11:00  Glucose-Capillary Latest Range: 70-99 mg/dL 409 (H) 811 (H) 914 (H) 168 (H) 231 (H)    Inpatient Diabetes Program Recommendations Insulin - Basal: Note Lantus 8 units started 09/15/11.  Continue to titrate based on fasting CBG's. Correction (SSI): Increase correction to moderate-tid with meals and HS scale. HgbA1C: A1C=10.9% indicating average CBG's 267 mg/dL.  Note: Will follow.

## 2011-09-16 NOTE — Progress Notes (Signed)
Stroke Team Progress Note  HISTORY Willie Andersen is an 61 y.o. male who woke up 09/13/2011 around 10:00 am and tried to put on his pants while getting out of bed, but he fell as he was getting out of bed and was on the ground for some time until he was found and brought to the hospital. Stroke code was cancelled as he was last seen normal 09/12/2011 at 2200. He had left hemiparesis with left facial droop. He was not a tPA candidate secondary to presenting beyond the time window. He was admitted for further evaluation and treatment.  SUBJECTIVE His 2 sisters and daughter are the bedside. Overall he feels his condition is stable. He wants to go home, family is more reasonable.  OBJECTIVE Most recent Vital Signs: Temp: 98.3 F (36.8 C) (01/24 0600) BP: 143/72 mmHg (01/24 0600) Pulse Rate: 77  (01/24 0600) Respiratory Rate: 18 O2 Saturation: 93%  CBG (last 3) Basename 09/16/11 0754 09/16/11 0413 09/15/11 2345  GLUCAP 168* 162* 208*   Intake/Output from previous day: 01/23 0701 - 01/24 0700 In: 2975.4 [P.O.:240; I.V.:2735.4] Out: 1652 [Urine:1650; Stool:2]  IV Fluid Intake:     . DISCONTD: sodium chloride 125 mL/hr at 09/16/11 0659   Medications    . aspirin  300 mg Rectal Daily   Or  . aspirin  325 mg Oral Daily  . enoxaparin (LOVENOX) injection  40 mg Subcutaneous Q24H  . insulin aspart  0-9 Units Subcutaneous Q4H  . insulin glargine  8 Units Subcutaneous Daily  . simvastatin  20 mg Oral q1800  PRN acetaminophen, acetaminophen, ondansetron (ZOFRAN) IV, senna-docusate  Diet:  Dysphagia thin liquids Activity:  Bedrest DVT Prophylaxis:  Lovenox 40 mg sq daily   Studies: CBC    Component Value Date/Time   WBC 5.8 09/16/2011 0617   RBC 4.69 09/16/2011 0617   HGB 14.6 09/16/2011 0617   HCT 41.1 09/16/2011 0617   PLT 213 09/16/2011 0617   MCV 87.6 09/16/2011 0617   MCH 31.1 09/16/2011 0617   MCHC 35.5 09/16/2011 0617   RDW 12.2 09/16/2011 0617   LYMPHSABS 2.4 09/13/2011 1531   MONOABS 0.6 09/13/2011 1531   EOSABS 0.0 09/13/2011 1531   BASOSABS 0.0 09/13/2011 1531   CMP    Component Value Date/Time   NA 136 09/16/2011 0617   K 3.9 09/16/2011 0617   CL 105 09/16/2011 0617   CO2 22 09/16/2011 0617   GLUCOSE 165* 09/16/2011 0617   BUN 5* 09/16/2011 0617   CREATININE 0.77 09/16/2011 0617   CALCIUM 8.8 09/16/2011 0617   PROT 8.4* 09/13/2011 1531   ALBUMIN 4.2 09/13/2011 1531   AST 23 09/13/2011 1531   ALT 23 09/13/2011 1531   ALKPHOS 93 09/13/2011 1531   BILITOT 1.1 09/13/2011 1531   GFRNONAA >90 09/16/2011 0617   GFRAA >90 09/16/2011 0617   COAGS Lab Results  Component Value Date   INR 0.87 09/13/2011   INR 0.89 09/22/2010   INR 0.86 09/14/2010   Lipid Panel    Component Value Date/Time   CHOL 182 09/14/2011 0635   TRIG 179* 09/14/2011 0635   HDL 39* 09/14/2011 0635   CHOLHDL 4.7 09/14/2011 0635   VLDL 36 09/14/2011 0635   LDLCALC 107* 09/14/2011 0635   HgbA1C  Lab Results  Component Value Date   HGBA1C 10.9* 09/14/2011   Urine Drug Screen    Component Value Date/Time   LABOPIA POSITIVE* 09/22/2010 1632   COCAINSCRNUR NONE DETECTED 09/22/2010 1632   LABBENZ  NONE DETECTED 09/22/2010 1632   AMPHETMU NONE DETECTED 09/22/2010 1632   THCU NONE DETECTED 09/22/2010 1632   LABBARB  Value: NONE DETECTED        DRUG SCREEN FOR MEDICAL PURPOSES ONLY.  IF CONFIRMATION IS NEEDED FOR ANY PURPOSE, NOTIFY LAB WITHIN 5 DAYS.        LOWEST DETECTABLE LIMITS FOR URINE DRUG SCREEN Drug Class       Cutoff (ng/mL) Amphetamine      1000 Barbiturate      200 Benzodiazepine   200 Tricyclics       300 Opiates          300 Cocaine          300 THC              50 09/22/2010 1632    Alcohol Level    Component Value Date/Time   ETH  Value: <5        LOWEST DETECTABLE LIMIT FOR SERUM ALCOHOL IS 5 mg/dL FOR MEDICAL PURPOSES ONLY 09/22/2010 1633  Cardiac Enzymes neg UA neg   CT of the brain   Hypodensity the high right parietal regions suggests acute to subacute ischemia.  No evidence for associated  hemorrhage.  Postsurgical encephalomalacia in the posterior right frontal lobe.   MRI of the brain  Acute right middle cerebral artery infarct.  Postsurgical changes on the right with encephalomalacia in the right lateral temporal lobe.   MRA of the brain  severe stenosis of the right MCA bifurcation corresponding to the acute area of infarct.  There is flow distal to the stenosis.   2D Echocardiogram  EF 50% with no source of embolus.   Carotid Doppler  No internal carotid artery stenosis bilaterally. Vertebrals with antegrade flow bilaterally.   CXR  No active disease.  No significant change.  EKG  normal sinus rhythm.   Physical Exam   Pleasant middle-aged African American gentleman sitting up in bed with support from the occupational therapist. He is leaning to the right side. He is afebrile. Head is nontraumatic. Abdomen soft nontender.cardiac exam no murmur or gallop. Lungs clear to auscultation. Neurological exam awake alert oriented to time place and person. Mild dysarthria. No aphasia. A right gaze preference left gaze palsy. He does not blink to threat on the left but does on the right. Left lower facial weakness. Dense left hemiplegia 0/5 strength with hypotonia. Purposeful antigravity strength on the right. Right plantar is downgoing left is upgoing.  Coordination and gait cannot be tested.  ASSESSMENT Mr. Willie Andersen is a 61 y.o. 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 Has left hemiplegia and neglect. On aspirin 325 mg orally every day for secondary stroke prevention. History of craniotomy for right frontal meningioma resection in the past. Physical therapy recommends in patient rehab consult. Note plans for SNF.  Diabetes. New diagnosis. Needs diabetic meds/education.  Stroke risk factors:  diabetes mellitus, hypertension, smoking and previous history of stroke  Hospital day #  3  TREATMENT/PLAN TEE to look for embolic source. If positive for PFO (patent foramen ovale), needs bilateral lower extremity dopplers to rule out DVT as source of stroke. Will need to call cardiologist on call or cardiologist of pt choice to do procedure. NPO after MN for TEE, Continue aspirin 325 mg orally every day for secondary stroke prevention. Ongoing risk factor control. Rehab consult if family can provide care following discharge. Dr.  Elani Delph discussed recommendations with Dr. Clyde Lundborg.  Joaquin Music, ANP-BC, GNP-BC [facility] Pager: [phone] 09/16/2011 10:41 AM  Dr. Delia Heady, Stroke Center Medical Director, has personally reviewed chart, pertinent data, examined the patient and developed the plan of care.

## 2011-09-17 ENCOUNTER — Encounter (HOSPITAL_COMMUNITY): Payer: Self-pay | Admitting: *Deleted

## 2011-09-17 ENCOUNTER — Encounter (HOSPITAL_COMMUNITY)
Admission: EM | Disposition: A | Discharge status: Skilled Nursing Facility | Payer: Self-pay | Source: Home / Self Care | Attending: Infectious Diseases

## 2011-09-17 DIAGNOSIS — I6789 Other cerebrovascular disease: Secondary | ICD-10-CM

## 2011-09-17 HISTORY — PX: TEE WITHOUT CARDIOVERSION: SHX5443

## 2011-09-17 LAB — GLUCOSE, CAPILLARY
Glucose-Capillary: 173 mg/dL — ABNORMAL HIGH (ref 70–99)
Glucose-Capillary: 188 mg/dL — ABNORMAL HIGH (ref 70–99)
Glucose-Capillary: 222 mg/dL — ABNORMAL HIGH (ref 70–99)
Glucose-Capillary: 238 mg/dL — ABNORMAL HIGH (ref 70–99)

## 2011-09-17 SURGERY — ECHOCARDIOGRAM, TRANSESOPHAGEAL
Anesthesia: Moderate Sedation

## 2011-09-17 MED ORDER — SODIUM CHLORIDE 0.9 % IJ SOLN: 3.0000 mL | INTRAMUSCULAR | Status: DC | PRN

## 2011-09-17 MED ORDER — FENTANYL CITRATE 0.05 MG/ML IJ SOLN
INTRAMUSCULAR | Status: DC | PRN
  Administered 2011-09-17 (×2): 25 ug via INTRAVENOUS

## 2011-09-17 MED ORDER — MIDAZOLAM HCL 10 MG/2ML IJ SOLN: 10.0000 mg | Freq: Once | INTRAMUSCULAR | Status: DC

## 2011-09-17 MED ORDER — FENTANYL CITRATE 0.05 MG/ML IJ SOLN
INTRAMUSCULAR | Status: AC
  Filled 2011-09-17: qty 4

## 2011-09-17 MED ORDER — LIDOCAINE VISCOUS 2 % MT SOLN
OROMUCOSAL | Status: DC | PRN
  Administered 2011-09-17: 5 mL via OROMUCOSAL

## 2011-09-17 MED ORDER — FENTANYL CITRATE 0.05 MG/ML IJ SOLN: 250.0000 ug | Freq: Once | INTRAMUSCULAR | Status: DC

## 2011-09-17 MED ORDER — MIDAZOLAM HCL 10 MG/2ML IJ SOLN
INTRAMUSCULAR | Status: DC | PRN
  Administered 2011-09-17 (×2): 2 mg via INTRAVENOUS

## 2011-09-17 MED ORDER — SODIUM CHLORIDE 0.9 % IJ SOLN
3.0000 mL | Freq: Two times a day (BID) | INTRAMUSCULAR | Status: DC
  Administered 2011-09-17 – 2011-09-20 (×5): 3 mL via INTRAVENOUS

## 2011-09-17 MED ORDER — BENZOCAINE 20 % MT SOLN: 1.0000 "application " | OROMUCOSAL | Status: DC | PRN

## 2011-09-17 MED ORDER — SODIUM CHLORIDE 0.9 % IV SOLN: 250.0000 mL | INTRAVENOUS | Status: DC | PRN

## 2011-09-17 MED ORDER — DIPHENHYDRAMINE HCL 50 MG/ML IJ SOLN
INTRAMUSCULAR | Status: AC
  Filled 2011-09-17: qty 1

## 2011-09-17 MED ORDER — SODIUM CHLORIDE 0.45 % IV SOLN: INTRAVENOUS | Status: DC

## 2011-09-17 MED ORDER — MIDAZOLAM HCL 10 MG/2ML IJ SOLN
INTRAMUSCULAR | Status: AC
  Filled 2011-09-17: qty 4

## 2011-09-17 MED ORDER — LIDOCAINE VISCOUS 2 % MT SOLN
OROMUCOSAL | Status: AC
  Filled 2011-09-17: qty 15

## 2011-09-17 NOTE — Progress Notes (Signed)
  Echocardiogram Echocardiogram Transesophageal has been performed.  Willie Andersen, Real Cons 09/17/2011, 1:50 PM

## 2011-09-17 NOTE — Progress Notes (Signed)
Stroke Team Progress Note  HISTORY Willie Andersen is an 61 y.o. male who woke up 09/13/2011 around 10:00 am and tried to put on his pants while getting out of bed, but he fell as he was getting out of bed and was on the ground for some time until he was found and brought to the hospital. Stroke code was cancelled as he was last seen normal 09/12/2011 at 2200. He had left hemiparesis with left facial droop. He was not a tPA candidate secondary to presenting beyond the time window. He was admitted for further evaluation and treatment.  SUBJECTIVE The patient is alert and cooperative, complains of a slight headache. The patient indicates that he has been able to eat and drink without choking. Patient has been up in a chair. Has been NPO since 64m.  OBJECTIVE Most recent Vital Signs: Temp: 98 F (36.7 C) (01/25 0624) Temp src: Oral (01/25 0624) BP: 137/84 mmHg (01/25 0624) Pulse Rate: 67  (01/25 0624) Respiratory Rate: 20 O2 Saturation: 98%  CBG (last 3) Basename 09/17/11 0720 09/17/11 0434 09/17/11 0009  GLUCAP 194* 222* 188*   Intake/Output from previous day: 01/24 0701 - 01/25 0700 In: 120 [P.O.:120] Out: 3200 [Urine:3200]  IV Fluid Intake:     . DISCONTD: sodium chloride 125 mL/hr at 09/16/11 0659   Medications    . dipyridamole-aspirin  1 capsule Oral BID  . enoxaparin (LOVENOX) injection  40 mg Subcutaneous Q24H  . insulin aspart  0-9 Units Subcutaneous Q4H  . insulin glargine  12 Units Subcutaneous Daily  . simvastatin  20 mg Oral q1800  . DISCONTD: aspirin  300 mg Rectal Daily  . DISCONTD: aspirin  325 mg Oral Daily  . DISCONTD: insulin glargine  8 Units Subcutaneous Daily  PRN acetaminophen, acetaminophen, ondansetron (ZOFRAN) IV, senna-docusate  Diet:  NPO  Activity:  Bedrest DVT Prophylaxis:  Lovenox 40 mg sq daily   Studies: CBC    Component Value Date/Time   WBC 5.8 09/16/2011 0617   RBC 4.69 09/16/2011 0617   HGB 14.6 09/16/2011 0617   HCT 41.1 09/16/2011 0617    PLT 213 09/16/2011 0617   MCV 87.6 09/16/2011 0617   MCH 31.1 09/16/2011 0617   MCHC 35.5 09/16/2011 0617   RDW 12.2 09/16/2011 0617   LYMPHSABS 2.4 09/13/2011 1531   MONOABS 0.6 09/13/2011 1531   EOSABS 0.0 09/13/2011 1531   BASOSABS 0.0 09/13/2011 1531   CMP    Component Value Date/Time   NA 136 09/16/2011 0617   K 3.9 09/16/2011 0617   CL 105 09/16/2011 0617   CO2 22 09/16/2011 0617   GLUCOSE 165* 09/16/2011 0617   BUN 5* 09/16/2011 0617   CREATININE 0.77 09/16/2011 0617   CALCIUM 8.8 09/16/2011 0617   PROT 8.4* 09/13/2011 1531   ALBUMIN 4.2 09/13/2011 1531   AST 23 09/13/2011 1531   ALT 23 09/13/2011 1531   ALKPHOS 93 09/13/2011 1531   BILITOT 1.1 09/13/2011 1531   GFRNONAA >90 09/16/2011 0617   GFRAA >90 09/16/2011 0617   COAGS Lab Results  Component Value Date   INR 0.87 09/13/2011   INR 0.89 09/22/2010   INR 0.86 09/14/2010   Lipid Panel    Component Value Date/Time   CHOL 182 09/14/2011 0635   TRIG 179* 09/14/2011 0635   HDL 39* 09/14/2011 0635   CHOLHDL 4.7 09/14/2011 0635   VLDL 36 09/14/2011 0635   LDLCALC 107* 09/14/2011 0635   HgbA1C  Lab Results  Component Value  Date   HGBA1C 10.9* 09/14/2011   Urine Drug Screen    Component Value Date/Time   LABOPIA POSITIVE* 09/22/2010 1632   COCAINSCRNUR NONE DETECTED 09/22/2010 1632   LABBENZ NONE DETECTED 09/22/2010 1632   AMPHETMU NONE DETECTED 09/22/2010 1632   THCU NONE DETECTED 09/22/2010 1632   LABBARB  Value: NONE DETECTED        DRUG SCREEN FOR MEDICAL PURPOSES ONLY.  IF CONFIRMATION IS NEEDED FOR ANY PURPOSE, NOTIFY LAB WITHIN 5 DAYS.        LOWEST DETECTABLE LIMITS FOR URINE DRUG SCREEN Drug Class       Cutoff (ng/mL) Amphetamine      1000 Barbiturate      200 Benzodiazepine   200 Tricyclics       300 Opiates          300 Cocaine          300 THC              50 09/22/2010 1632    Alcohol Level    Component Value Date/Time   ETH  Value: <5        LOWEST DETECTABLE LIMIT FOR SERUM ALCOHOL IS 5 mg/dL FOR MEDICAL PURPOSES ONLY  09/22/2010 1633  Cardiac Enzymes neg UA neg   CT of the brain   Hypodensity the high right parietal regions suggests acute to subacute ischemia.  No evidence for associated hemorrhage.  Postsurgical encephalomalacia in the posterior right frontal lobe.   MRI of the brain  Acute right middle cerebral artery infarct.  Postsurgical changes on the right with encephalomalacia in the right lateral temporal lobe.   MRA of the brain  severe stenosis of the right MCA bifurcation corresponding to the acute area of infarct.  There is flow distal to the stenosis.   2D Echocardiogram  EF 50% with no source of embolus.   Carotid Doppler  No internal carotid artery stenosis bilaterally. Vertebrals with antegrade flow bilaterally.   CXR  No active disease.  No significant change.  EKG  normal sinus rhythm.   Physical Exam    The patient is alert and cooperative at the time of the examination.  The patient appears to have a mild left inferior quadrantanopsia on visual field testing.  The patient has a significant left hemiparesis, almost flaccid left upper extremity, without neglect.  The patient has 3/5 strength of the left lower extremity.  The patient is able to perform finger-nose-finger and toe to finger with the right side, not on the left.  The patient appears to have some mild left facial droop, speech is mildly dysarthric, not aphasic.  Deep tendon reflexes are depressed but symmetric.   ASSESSMENT Willie Andersen is a 61 y.o. 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 Has left hemiplegia and neglect. Started  dipyridamole SR 250 mg/aspirin 25 mg orally twice a day for secondary stroke prevention. History of craniotomy for right frontal meningioma resection in the past. Physical therapy recommends in patient rehab consult. Note plans for SNF.  Dyslipidemia, on zocor  Diabetes. New diagnosis. Needs  diabetic meds/education.  Stroke risk factors:  diabetes mellitus, hyperlipidemia, hypertension, smoking and previous history of stroke  Hospital day # 4  TREATMENT/PLAN - Recommend TEE to look for embolic source. If positive for PFO (patent foramen ovale), needs bilateral lower extremity dopplers to rule out DVT as source of stroke. Per RN, resident called cardiologist on  call but can't remember who. Not on schedule for TEE today. Neither [facility] or [facility] have them on their schedule. Pt has been NPO. Corinda Gubler will ask Dr. Tenny Craw if she can do after clinic today. ** TEE sxcheduled with Dr. Tenny Craw at 1330 today. Endo and RN aware.** - Continue dipyridamole SR 250 mg/aspirin 25 mg orally twice a day for secondary stroke prevention.  - Ongoing risk factor control. - Rehab consult if family can provide care following discharge, otherwise SNF.  Joaquin Music, ANP-BC, GNP-BC [facility] Pager: [phone] 09/17/2011 9:40 AM  Dr. Lesia Sago has personally reviewed chart, pertinent data, examined the patient and developed the plan of care.

## 2011-09-17 NOTE — Progress Notes (Signed)
Subjective:  Patient feels fine.  Patient is interactive. No fever or chills. No chest pain or SOB. His paralysis has not improved.  Objective: Vital signs in last 24 hours: Filed Vitals:   09/14/11 0002 09/14/11 0240 09/14/11 0420 09/14/11 0636  BP: 150/98 139/88 138/88 122/81  Pulse: 68 72 66 72  Temp: 98.4 F (36.9 C) 98.6 F (37 C) 98.6 F (37 C) 98.6 F (37 C)  TempSrc: Oral Oral Oral Oral  Resp: 16 17 16 18   Height:      Weight:      SpO2: 97% 96% 97% 96%   Weight change:   Intake/Output Summary (Last 24 hours) at 09/17/11 0804 Last data filed at 09/17/11 0230  Gross per 24 hour  Intake    120 ml  Output   3200 ml  Net  -3080 ml   Physical Exam: General: resting in bed, not in acute distress HEENT: PERRL, EOMI, no scleral icterus. His left eye has extra fat-like tissue in the lateral side of sclera. Cardiac: S1/S2, RRR, No murmurs, gallops or rubs Pulm: Good air movement bilaterally, Clear to auscultation bilaterally, No rales, wheezing, rhonchi or rubs. Abd: Soft,  nondistended, nontender, no rebound pain, no organomegaly, BS present Ext: No rashes or edema, 2+DP/PT pulse bilaterally Neuro:   Mental Status: Alert, oriented, thought content appropriate.  Speech fluent without evidence of aphasia.  Able to follow commands.  Cranial Nerves: II: visual fields grossly normal, pupils equal, round, reactive to light and accommodation III,IV, VI: ptosis not present, extraocular muscles extra-ocular motions intact bilaterally V,VII: smile asymmetric, left facial droopy. facial light touch sensation normal bilaterally VIII: hearing normal bilaterally IX,X: gag reflex present XI: decreased trapezius strength on the left.   XII: tongue deviated to the left    Motor: 5/5 on the right upper and lower extremities. 0/5 on the left upper extremity and 1/5 on the left lower extremity. Sensory: light touch intact throughout, bilaterally Deep Tendon Reflexes: 2+ right knee  reflex and 3+ left knee reflex Babinski sign: negative on both side.  Cerebellar: Normal finger-to-nose test with left hand.    Lab Results: Basic Metabolic Panel:  Basic Metabolic Panel:    Component Value Date/Time   NA 136 09/16/2011 0617   K 3.9 09/16/2011 0617   CL 105 09/16/2011 0617   CO2 22 09/16/2011 0617   BUN 5* 09/16/2011 0617   CREATININE 0.77 09/16/2011 0617   GLUCOSE 165* 09/16/2011 0617   CALCIUM 8.8 09/16/2011 0617     Lab 09/13/11 1539 09/13/11 1531  NA 136 132*  K 4.5 4.4  CL 101 96  CO2 -- 23  GLUCOSE 280* 283*  BUN 5* 6  CREATININE 1.10 0.93  CALCIUM -- 10.4  MG -- --  PHOS -- --   Liver Function Tests:  Lab 09/13/11 1531  AST 23  ALT 23  ALKPHOS 93  BILITOT 1.1  PROT 8.4*  ALBUMIN 4.2   CBC:  CBC:    Component Value Date/Time   WBC 5.8 09/16/2011 0617   HGB 14.6 09/16/2011 0617   HCT 41.1 09/16/2011 0617   PLT 213 09/16/2011 0617   MCV 87.6 09/16/2011 0617   NEUTROABS 7.5 09/13/2011 1531   LYMPHSABS 2.4 09/13/2011 1531   MONOABS 0.6 09/13/2011 1531   EOSABS 0.0 09/13/2011 1531   BASOSABS 0.0 09/13/2011 1531       Lab 09/16/11 0617 09/13/11 1539 09/13/11 1531  WBC 5.8 -- 10.6*  NEUTROABS -- -- 7.5  HGB  14.6 18.4* --  HCT 41.1 54.0* --  MCV 87.6 -- 87.2  PLT 213 -- 260   Cardiac Enzymes:  Lab 09/13/11 1531  CKTOTAL 166  CKMB 2.4  CKMBINDEX --  TROPONINI <0.30   CBG:  Lab 09/17/11 0720 09/17/11 0434 09/17/11 0009 09/16/11 2006 09/16/11 1754 09/16/11 1634  GLUCAP 194* 222* 188* 241* 212* 161*   Hemoglobin A1C:  Lab 09/14/11 0635  HGBA1C 10.9*   Fasting Lipid Panel:  Lab 09/14/11 0635  CHOL 182  HDL 39*  LDLCALC 107*  TRIG 179*  CHOLHDL 4.7  LDLDIRECT --   Coagulation:  Lab 09/13/11 1531  LABPROT 12.0  INR 0.87    Drugs of Abuse     Component Value Date/Time   LABOPIA POSITIVE* 09/22/2010 1632   COCAINSCRNUR NONE DETECTED 09/22/2010 1632   LABBENZ NONE DETECTED 09/22/2010 1632   AMPHETMU NONE DETECTED  09/22/2010 1632   THCU NONE DETECTED 09/22/2010 1632   LABBARB  Value: NONE DETECTED        DRUG SCREEN FOR MEDICAL PURPOSES ONLY.  IF CONFIRMATION IS NEEDED FOR ANY PURPOSE, NOTIFY LAB WITHIN 5 DAYS.        LOWEST DETECTABLE LIMITS FOR URINE DRUG SCREEN Drug Class       Cutoff (ng/mL) Amphetamine      1000 Barbiturate      200 Benzodiazepine   200 Tricyclics       300 Opiates          300 Cocaine          300 THC              50 09/22/2010 1632     Micro Results: Recent Results (from the past 240 hour(s))  URINE CULTURE     Status: Normal   Collection Time   09/13/11  5:57 PM      Component Value Range Status Comment   Specimen Description URINE, RANDOM   Final    Special Requests NONE   Final    Setup Time [id number]   Final    Colony Count >=100,000 COLONIES/ML   Final    Culture     Final    Value: Multiple bacterial morphotypes present, none predominant. Suggest appropriate recollection if clinically indicated.   Report Status 09/14/2011 FINAL   Final    Studies/Results: Dg Chest 2 View  09/13/2011  *RADIOLOGY REPORT*  Clinical Data: Pain, weakness  CHEST - 2 VIEW  Comparison: 09/22/2010  Findings: Cardiomediastinal silhouette is stable.  No acute infiltrate or pleural effusion.  No pulmonary edema.  The bony thorax is stable.  Mild hyperinflation again noted.  IMPRESSION: No active disease.  No significant change.  Original Report Authenticated By: Natasha Mead, M.D.   Ct Head Wo Contrast  09/13/2011  *RADIOLOGY REPORT*  Clinical Data: Code stroke  CT HEAD WITHOUT CONTRAST  Technique:  Contiguous axial images were obtained from the base of the skull through the vertex without contrast.  Comparison: 10/22/2010  Findings: Postoperative encephalomalacia is seen in the posterior right frontal lobe, the site of previous meningioma resection. Hypodensity in the high right parietal region was not  seen on the previous CT.  No evidence for acute hemorrhage, hydrocephalus, or abnormal extra-axial  fluid collection.  No definite CT evidence for acute infarction.  The visualized paranasal sinuses and mastoid air cells are clear.  IMPRESSION: Hypodensity the high right parietal regions suggests acute to subacute ischemia.  No evidence for associated hemorrhage.  Postsurgical encephalomalacia in the  posterior right frontal lobe.  I personally called these results to Dr. Freida Busman in the emergency department at 1552 hours on 09/13/2011.  Original Report Authenticated By: ERIC A. MANSELL, M.D.   Medications:  Scheduled Meds:    . dipyridamole-aspirin  1 capsule Oral BID  . enoxaparin (LOVENOX) injection  40 mg Subcutaneous Q24H  . insulin aspart  0-9 Units Subcutaneous Q4H  . insulin glargine  12 Units Subcutaneous Daily  . simvastatin  20 mg Oral q1800  . DISCONTD: aspirin  300 mg Rectal Daily  . DISCONTD: aspirin  325 mg Oral Daily  . DISCONTD: insulin glargine  8 Units Subcutaneous Daily   Continuous Infusions:    . DISCONTD: sodium chloride 125 mL/hr at 09/16/11 0659   PRN Meds:.acetaminophen, acetaminophen, ondansetron (ZOFRAN) IV, senna-docusate Assessment/Plan:   1) Acute right parietal CVA: patient's clinic picture is most consistent with stroke.  Head CT shows no hemorrhage. So ischemic stroke is the most likely diagnosis. Differential diagnosis includes recurrent brain meningioma, given his history of meningioma surgery in 2012. But CT scan did not show the recurrent of brain tumor. Carotid Doppler shows No significant extracranial carotid artery stenosis demonstrated. Vertebrals are patent with antegrade flow. MRI: Acute right middle cerebral artery infarct. Postsurgical changes on the right with encephalomalacia in the right lateral temporal lobe. 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 There is flow distal to the stenosis. Out of the window for tPA. 2D-echo showed Normal LV size and systolic function, EF 55%. Normal RV size and  systolic function. No significant valvular abnormality. The RA was mildly dilated.  - Appreciate Neurology's help in managing our patient. follow Dr. Marlis Edelson suggestion to get TEE today. -will continue Aggrenox for secondary prevention of stroke - PT/ OT evaluation and rehabilitation.  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 Stable postop changes per CT scan.  3) DM-II: patient A1c was 10.9 on this admission. Patient has not been on any medications at home. Glucose is 194.  -will continue lantus to 12 U daily -will continue  SSI.  4) .History of polysubstance abuse: Patient admits using cocaine.  -Social work consult.  5.) disposition: patient agrees to go to SNF on discharge. SW is working on this. Will talk to his daughter about disposition.  6) DVT prophylaxis: Lovenox    LOS: 4 days   Lorretta Harp 09/17/2011, 8:04 AM

## 2011-09-17 NOTE — Brief Op Note (Signed)
09/13/2011 - 09/17/2011  1:34 PM  PATIENT:  Willie Andersen  61 y.o. male  PRE-OPERATIVE DIAGNOSIS:  stroke  POST-OPERATIVE DIAGNOSIS:  stroke  PROCEDURE:  Procedure(s): TRANSESOPHAGEAL ECHOCARDIOGRAM (TEE)   LA LA appendage without masses.  TV normal.  No TR. MV normal.  No signif MR AV normal  NO AI PV normal. NO PI Normal LV and RV systolic function Normal thoracic aorta No PFO as tested by color doppler or with injection of agitated saline with cough and Valsalva.

## 2011-09-17 NOTE — Progress Notes (Signed)
Covering Clinical Child psychotherapist (CSW) completed psychosocial assessment which can be found in pt shadow chart. CSW visited pt room and discussed placement options. Pt appeared sleepy but responded appropriately to questions. Pt stated he is agreeable to going to a skilled nursing facility (SNF) and would prefer to go to [facility] as it is close to his home. CSW provided pt with a SNF list in the case that his first choice is not available. CSW will follow up with offers. FL2 has been placed in shadow chart to be signed by MD. Theresia Bough, MSW, LCSWA [phone]

## 2011-09-18 LAB — GLUCOSE, CAPILLARY
Glucose-Capillary: 228 mg/dL — ABNORMAL HIGH (ref 70–99)
Glucose-Capillary: 170 mg/dL — ABNORMAL HIGH (ref 70–99)
Glucose-Capillary: 241 mg/dL — ABNORMAL HIGH (ref 70–99)

## 2011-09-18 MED ORDER — DEXTROSE-NACL 5-0.45 % IV SOLN
INTRAVENOUS | Status: DC
  Administered 2011-09-18 – 2011-09-21 (×4): via INTRAVENOUS

## 2011-09-18 MED ORDER — ONDANSETRON 8 MG/NS 50 ML IVPB
8.0000 mg | INTRAVENOUS | Status: DC | PRN
  Administered 2011-09-19 – 2011-09-20 (×3): 8 mg via INTRAVENOUS
  Filled 2011-09-18 (×5): qty 8

## 2011-09-18 MED ORDER — INSULIN ASPART 100 UNIT/ML ~~LOC~~ SOLN
0.0000 [IU] | Freq: Every day | SUBCUTANEOUS | Status: DC
  Administered 2011-09-18: 2 [IU] via SUBCUTANEOUS

## 2011-09-18 MED ORDER — INSULIN ASPART 100 UNIT/ML ~~LOC~~ SOLN
0.0000 [IU] | Freq: Three times a day (TID) | SUBCUTANEOUS | Status: DC
  Administered 2011-09-18: 3 [IU] via SUBCUTANEOUS
  Administered 2011-09-18 – 2011-09-19 (×4): 2 [IU] via SUBCUTANEOUS
  Administered 2011-09-19: 3 [IU] via SUBCUTANEOUS
  Administered 2011-09-20 (×3): 2 [IU] via SUBCUTANEOUS
  Administered 2011-09-21: 3 [IU] via SUBCUTANEOUS
  Administered 2011-09-21: 2 [IU] via SUBCUTANEOUS

## 2011-09-18 MED ORDER — PROMETHAZINE HCL 25 MG PO TABS
12.5000 mg | ORAL_TABLET | Freq: Four times a day (QID) | ORAL | Status: DC | PRN
  Administered 2011-09-18 – 2011-09-20 (×3): 12.5 mg via ORAL
  Filled 2011-09-18 (×3): qty 1

## 2011-09-18 NOTE — Progress Notes (Signed)
Subjective:  Patient feels fine.  Patient is interactive. No fever or chills. No chest pain or SOB. His paralysis has not improved.  Objective: Vital signs in last 24 hours: Filed Vitals:   09/14/11 0002 09/14/11 0240 09/14/11 0420 09/14/11 0636  BP: 150/98 139/88 138/88 122/81  Pulse: 68 72 66 72  Temp: 98.4 F (36.9 C) 98.6 F (37 C) 98.6 F (37 C) 98.6 F (37 C)  TempSrc: Oral Oral Oral Oral  Resp: 16 17 16 18   Height:      Weight:      SpO2: 97% 96% 97% 96%   Weight change:   Intake/Output Summary (Last 24 hours) at 09/18/11 1154 Last data filed at 09/18/11 0345  Gross per 24 hour  Intake    300 ml  Output    900 ml  Net   -600 ml   Physical Exam: General: resting in bed, not in acute distress HEENT: PERRL, EOMI, no scleral icterus. His left eye has extra fat-like tissue in the lateral side of sclera. Cardiac: S1/S2, RRR, No murmurs, gallops or rubs Pulm: Good air movement bilaterally, Clear to auscultation bilaterally, No rales, wheezing, rhonchi or rubs. Abd: Soft,  nondistended, nontender, no rebound pain, no organomegaly, BS present Ext: No rashes or edema, 2+DP/PT pulse bilaterally Neuro:   Mental Status: Alert, oriented, thought content appropriate.  Speech fluent without evidence of aphasia.  Able to follow commands.  Cranial Nerves: II: visual fields grossly normal, pupils equal, round, reactive to light and accommodation III,IV, VI: ptosis not present, extraocular muscles extra-ocular motions intact bilaterally V,VII: smile asymmetric, left facial droopy. facial light touch sensation normal bilaterally VIII: hearing normal bilaterally IX,X: gag reflex present XI: decreased trapezius strength on the left.   XII: tongue deviated to the left    Motor: 5/5 on the right upper and lower extremities. 0/5 on the left upper extremity and 1/5 on the left lower extremity. Sensory: light touch intact throughout, bilaterally Deep Tendon Reflexes: 2+ right knee  reflex and 3+ left knee reflex Babinski sign: negative on both side.  Cerebellar: Normal finger-to-nose test with left hand.    Lab Results: Basic Metabolic Panel:  Basic Metabolic Panel:    Component Value Date/Time   NA 136 09/16/2011 0617   K 3.9 09/16/2011 0617   CL 105 09/16/2011 0617   CO2 22 09/16/2011 0617   BUN 5* 09/16/2011 0617   CREATININE 0.77 09/16/2011 0617   GLUCOSE 165* 09/16/2011 0617   CALCIUM 8.8 09/16/2011 0617     Lab 09/13/11 1539 09/13/11 1531  NA 136 132*  K 4.5 4.4  CL 101 96  CO2 -- 23  GLUCOSE 280* 283*  BUN 5* 6  CREATININE 1.10 0.93  CALCIUM -- 10.4  MG -- --  PHOS -- --   Liver Function Tests:  Lab 09/13/11 1531  AST 23  ALT 23  ALKPHOS 93  BILITOT 1.1  PROT 8.4*  ALBUMIN 4.2   CBC:  CBC:    Component Value Date/Time   WBC 5.8 09/16/2011 0617   HGB 14.6 09/16/2011 0617   HCT 41.1 09/16/2011 0617   PLT 213 09/16/2011 0617   MCV 87.6 09/16/2011 0617   NEUTROABS 7.5 09/13/2011 1531   LYMPHSABS 2.4 09/13/2011 1531   MONOABS 0.6 09/13/2011 1531   EOSABS 0.0 09/13/2011 1531   BASOSABS 0.0 09/13/2011 1531       Lab 09/16/11 0617 09/13/11 1539 09/13/11 1531  WBC 5.8 -- 10.6*  NEUTROABS -- -- 7.5  HGB 14.6 18.4* --  HCT 41.1 54.0* --  MCV 87.6 -- 87.2  PLT 213 -- 260   Cardiac Enzymes:  Lab 09/13/11 1531  CKTOTAL 166  CKMB 2.4  CKMBINDEX --  TROPONINI <0.30   CBG:  Lab 09/18/11 1130 09/18/11 0746 09/17/11 2230 09/17/11 1651 09/17/11 1214 09/17/11 0720  GLUCAP 170* 228* 173* 238* 156* 194*   Hemoglobin A1C:  Lab 09/14/11 0635  HGBA1C 10.9*   Fasting Lipid Panel:  Lab 09/14/11 0635  CHOL 182  HDL 39*  LDLCALC 107*  TRIG 179*  CHOLHDL 4.7  LDLDIRECT --   Coagulation:  Lab 09/13/11 1531  LABPROT 12.0  INR 0.87    Drugs of Abuse     Component Value Date/Time   LABOPIA POSITIVE* 09/22/2010 1632   COCAINSCRNUR NONE DETECTED 09/22/2010 1632   LABBENZ NONE DETECTED 09/22/2010 1632   AMPHETMU NONE DETECTED  09/22/2010 1632   THCU NONE DETECTED 09/22/2010 1632   LABBARB  Value: NONE DETECTED        DRUG SCREEN FOR MEDICAL PURPOSES ONLY.  IF CONFIRMATION IS NEEDED FOR ANY PURPOSE, NOTIFY LAB WITHIN 5 DAYS.        LOWEST DETECTABLE LIMITS FOR URINE DRUG SCREEN Drug Class       Cutoff (ng/mL) Amphetamine      1000 Barbiturate      200 Benzodiazepine   200 Tricyclics       300 Opiates          300 Cocaine          300 THC              50 09/22/2010 1632     Micro Results: Recent Results (from the past 240 hour(s))  URINE CULTURE     Status: Normal   Collection Time   09/13/11  5:57 PM      Component Value Range Status Comment   Specimen Description URINE, RANDOM   Final    Special Requests NONE   Final    Setup Time [id number]   Final    Colony Count >=100,000 COLONIES/ML   Final    Culture     Final    Value: Multiple bacterial morphotypes present, none predominant. Suggest appropriate recollection if clinically indicated.   Report Status 09/14/2011 FINAL   Final    Studies/Results: Dg Chest 2 View  09/13/2011  *RADIOLOGY REPORT*  Clinical Data: Pain, weakness  CHEST - 2 VIEW  Comparison: 09/22/2010  Findings: Cardiomediastinal silhouette is stable.  No acute infiltrate or pleural effusion.  No pulmonary edema.  The bony thorax is stable.  Mild hyperinflation again noted.  IMPRESSION: No active disease.  No significant change.  Original Report Authenticated By: Natasha Mead, M.D.   Ct Head Wo Contrast  09/13/2011  *RADIOLOGY REPORT*  Clinical Data: Code stroke  CT HEAD WITHOUT CONTRAST  Technique:  Contiguous axial images were obtained from the base of the skull through the vertex without contrast.  Comparison: 10/22/2010  Findings: Postoperative encephalomalacia is seen in the posterior right frontal lobe, the site of previous meningioma resection. Hypodensity in the high right parietal region was not  seen on the previous CT.  No evidence for acute hemorrhage, hydrocephalus, or abnormal extra-axial  fluid collection.  No definite CT evidence for acute infarction.  The visualized paranasal sinuses and mastoid air cells are clear.  IMPRESSION: Hypodensity the high right parietal regions suggests acute to subacute ischemia.  No evidence for associated hemorrhage.  Postsurgical encephalomalacia in  the posterior right frontal lobe.  I personally called these results to Dr. Freida Busman in the emergency department at 1552 hours on 09/13/2011.  Original Report Authenticated By: ERIC A. MANSELL, M.D.   Medications:  Scheduled Meds:    . dipyridamole-aspirin  1 capsule Oral BID  . enoxaparin (LOVENOX) injection  40 mg Subcutaneous Q24H  . fentaNYL  250 mcg Intravenous Once  . insulin aspart  0-5 Units Subcutaneous QHS  . insulin aspart  0-9 Units Subcutaneous TID WC  . insulin glargine  12 Units Subcutaneous Daily  . midazolam  10 mg Intravenous Once  . simvastatin  20 mg Oral q1800  . sodium chloride  3 mL Intravenous Q12H  . DISCONTD: insulin aspart  0-9 Units Subcutaneous Q4H   Continuous Infusions:    . DISCONTD: sodium chloride     PRN Meds:.sodium chloride, acetaminophen, acetaminophen, benzocaine, ondansetron (ZOFRAN) IV, senna-docusate, sodium chloride, DISCONTD: fentaNYL, DISCONTD: lidocaine, DISCONTD: midazolam Assessment/Plan:   1) Acute right parietal CVA: patient's clinic picture is most consistent with stroke.  Head CT shows no hemorrhage. So ischemic stroke is the most likely diagnosis. Differential diagnosis includes recurrent brain meningioma, given his history of meningioma surgery in 2012. But CT scan did not show the recurrent of brain tumor. Carotid Doppler shows No significant extracranial carotid artery stenosis demonstrated. Vertebrals are patent with antegrade flow. MRI: Acute right middle cerebral artery infarct. Postsurgical changes on the right with encephalomalacia in the right lateral temporal lobe. MRA: Severe stenosis of the right MCA bifurcation corresponding to the  acute area of infarct. There is flow distal to the stenosis. Out of the window for tPA. 2D-echo showed Normal LV size and systolic function, EF 55%. Normal RV size and systolic function. No significant valvular abnormality. The RA was mildly dilated. TEE negative.   - Appreciate Neurology's help in managing our patient.  -will continue Aggrenox for secondary prevention of stroke - PT/ OT evaluation and rehabilitation. -consulted inpatient rehab and will follow up recommendations.  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 Stable postop changes per CT scan.  3) DM-II: patient A1c was 10.9 on this admission. Patient has not been on any medications at home. Glucose is 194.  -will continue lantus to 12 U daily -will continue  SSI.  4) .History of polysubstance abuse: Patient admits using cocaine.  -Social work consult.  5) DVT prophylaxis: Lovenox    LOS: 5 days   Lorretta Harp 09/18/2011, 11:54 AM

## 2011-09-18 NOTE — Progress Notes (Signed)
Stroke Team Progress Note  HISTORY Willie Andersen is an 61 y.o. male who woke up 09/13/2011 around 10:00 am and tried to put on his pants while getting out of bed, but he fell as he was getting out of bed and was on the ground for some time until he was found and brought to the hospital. Stroke code was cancelled as he was last seen normal 09/12/2011 at 2200. He had left hemiparesis with left facial droop. He was not a tPA candidate secondary to presenting beyond the time window. He was admitted for further evaluation and treatment.  SUBJECTIVE The patient is alert and cooperative, complains of a slight headache. The patient indicates that he has been able to eat and drink without choking. Patient has been up in a chair. The patient reports that he is sleepy this morning, but indicates that he slept well last night.  The patient indicates that he is somewhat queasy, and has not eaten breakfast this morning.  OBJECTIVE Most recent Vital Signs: Temp: 98.1 F (36.7 C) (01/26 0518) Temp src: Oral (01/26 0518) BP: 144/87 mmHg (01/26 0518) Pulse Rate: 71  (01/26 0518) Respiratory Rate: 20 O2 Saturation: 95%  CBG (last 3)  Basename 09/18/11 0746 09/17/11 2230 09/17/11 1651  GLUCAP 228* 173* 238*   Intake/Output from previous day: 01/25 0701 - 01/26 0700 In: 300 [I.V.:300] Out: 900 [Urine:900]  IV Fluid Intake:      . DISCONTD: sodium chloride     Medications     . dipyridamole-aspirin  1 capsule Oral BID  . enoxaparin (LOVENOX) injection  40 mg Subcutaneous Q24H  . fentaNYL  250 mcg Intravenous Once  . insulin aspart  0-5 Units Subcutaneous QHS  . insulin aspart  0-9 Units Subcutaneous TID WC  . insulin glargine  12 Units Subcutaneous Daily  . midazolam  10 mg Intravenous Once  . simvastatin  20 mg Oral q1800  . sodium chloride  3 mL Intravenous Q12H  . DISCONTD: insulin aspart  0-9 Units Subcutaneous Q4H  PRN sodium chloride, acetaminophen, acetaminophen, benzocaine,  ondansetron (ZOFRAN) IV, senna-docusate, sodium chloride, DISCONTD: fentaNYL, DISCONTD: lidocaine, DISCONTD: midazolam  Diet:  Dysphagia  Activity:  Bedrest DVT Prophylaxis:  Lovenox 40 mg sq daily   Studies: CBC    Component Value Date/Time   WBC 5.8 09/16/2011 0617   RBC 4.69 09/16/2011 0617   HGB 14.6 09/16/2011 0617   HCT 41.1 09/16/2011 0617   PLT 213 09/16/2011 0617   MCV 87.6 09/16/2011 0617   MCH 31.1 09/16/2011 0617   MCHC 35.5 09/16/2011 0617   RDW 12.2 09/16/2011 0617   LYMPHSABS 2.4 09/13/2011 1531   MONOABS 0.6 09/13/2011 1531   EOSABS 0.0 09/13/2011 1531   BASOSABS 0.0 09/13/2011 1531   CMP    Component Value Date/Time   NA 136 09/16/2011 0617   K 3.9 09/16/2011 0617   CL 105 09/16/2011 0617   CO2 22 09/16/2011 0617   GLUCOSE 165* 09/16/2011 0617   BUN 5* 09/16/2011 0617   CREATININE 0.77 09/16/2011 0617   CALCIUM 8.8 09/16/2011 0617   PROT 8.4* 09/13/2011 1531   ALBUMIN 4.2 09/13/2011 1531   AST 23 09/13/2011 1531   ALT 23 09/13/2011 1531   ALKPHOS 93 09/13/2011 1531   BILITOT 1.1 09/13/2011 1531   GFRNONAA >90 09/16/2011 0617   GFRAA >90 09/16/2011 0617   COAGS Lab Results  Component Value Date   INR 0.87 09/13/2011   INR 0.89 09/22/2010   INR 0.86  09/14/2010   Lipid Panel    Component Value Date/Time   CHOL 182 09/14/2011 0635   TRIG 179* 09/14/2011 0635   HDL 39* 09/14/2011 0635   CHOLHDL 4.7 09/14/2011 0635   VLDL 36 09/14/2011 0635   LDLCALC 107* 09/14/2011 0635   HgbA1C  Lab Results  Component Value Date   HGBA1C 10.9* 09/14/2011   Urine Drug Screen    Component Value Date/Time   LABOPIA POSITIVE* 09/22/2010 1632   COCAINSCRNUR NONE DETECTED 09/22/2010 1632   LABBENZ NONE DETECTED 09/22/2010 1632   AMPHETMU NONE DETECTED 09/22/2010 1632   THCU NONE DETECTED 09/22/2010 1632   LABBARB  Value: NONE DETECTED        DRUG SCREEN FOR MEDICAL PURPOSES ONLY.  IF CONFIRMATION IS NEEDED FOR ANY PURPOSE, NOTIFY LAB WITHIN 5 DAYS.        LOWEST DETECTABLE LIMITS FOR URINE DRUG  SCREEN Drug Class       Cutoff (ng/mL) Amphetamine      1000 Barbiturate      200 Benzodiazepine   200 Tricyclics       300 Opiates          300 Cocaine          300 THC              50 09/22/2010 1632    Alcohol Level    Component Value Date/Time   ETH  Value: <5        LOWEST DETECTABLE LIMIT FOR SERUM ALCOHOL IS 5 mg/dL FOR MEDICAL PURPOSES ONLY 09/22/2010 1633  Cardiac Enzymes neg UA neg   CT of the brain   Hypodensity the high right parietal regions suggests acute to subacute ischemia.  No evidence for associated hemorrhage.  Postsurgical encephalomalacia in the posterior right frontal lobe.   MRI of the brain  Acute right middle cerebral artery infarct.  Postsurgical changes on the right with encephalomalacia in the right lateral temporal lobe.   MRA of the brain  severe stenosis of the right MCA bifurcation corresponding to the acute area of infarct.  There is flow distal to the stenosis.   2D Echocardiogram  EF 50% with no source of embolus.   Carotid Doppler  No internal carotid artery stenosis bilaterally. Vertebrals with antegrade flow bilaterally.   CXR  No active disease.  No significant change.  EKG  normal sinus rhythm.   TEE  Left ventricle: Normal LV systolic function.  ------------------------------------------------------------ Aortic valve: Normal AV. NO AI.  ------------------------------------------------------------ Aorta: Normal thoracic aorta.  ------------------------------------------------------------ Mitral valve: Normal mitral valve.  ------------------------------------------------------------ Left atrium: LA, LA appendage without masses.  ------------------------------------------------------------ Atrial septum: NO PFO as tested with injection of agitated saline with cough or Valsalva or as tested with color doppler.  ------------------------------------------------------------ Pulmonic valve: Normal pulmonic valve. No  PI>  ------------------------------------------------------------ Tricuspid valve: Normal TV No TR>  ------------------------------------------------------------ Post procedure conclusions Ascending Aorta:  - Normal thoracic aorta.     Physical Exam    The patient is sleepy, but he is cooperative at the time of the examination.  The patient appears to have a mild left inferior quadrantanopsia on visual field testing.  The patient has a significant left hemiparesis, almost flaccid left upper extremity, without neglect.  The patient has 3/5 strength of the left lower extremity.  The patient is able to perform finger-nose-finger and toe to finger with the right side, not on the left.  The patient appears to have some mild left facial droop, speech is  mildly dysarthric, not aphasic.  Deep tendon reflexes are depressed but symmetric.   ASSESSMENT Mr. Willie Andersen is a 61 y.o. 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 Has left hemiplegia and neglect. Started  dipyridamole SR 250 mg/aspirin 25 mg orally twice a day for secondary stroke prevention. History of craniotomy for right frontal meningioma resection in the past. Physical therapy recommends in patient rehab consult. Note plans for SNF.  Dyslipidemia, on zocor  Diabetes. New diagnosis. Needs diabetic meds/education.  Stroke risk factors:  diabetes mellitus, hyperlipidemia, hypertension, smoking and previous history of stroke  Hospital day # 5  TREATMENT/PLAN  The patient has undergone a TEE, and no cardiac source of embolus was noted.  -- Continue dipyridamole SR 250 mg/aspirin 25 mg orally twice a day for secondary stroke prevention.  - Ongoing risk factor control. - Rehab consult if family can provide care following discharge, otherwise SNF.  At this point, the neurologic workup has been completed. The patient will remain on  antiplatelet agents. Stroke service will sign off at this point.    09/18/2011 9:52 AM  Dr. Lesia Sago has personally reviewed chart, pertinent data, examined the patient and developed the plan of care.

## 2011-09-18 NOTE — Progress Notes (Signed)
Internal Medicine Teaching Service Attending Note Date: 09/18/2011  Patient name: Willie Andersen  Medical record number: [id number]  Date of birth: Sep 28, 1950    This patient has been seen and discussed with the house staff. Please see their note for complete details. I concur with their findings with the following additions/corrections:TEE shows no intracardiac clots or tumors. Agree with plans for rehab.  Lina Sayre 09/18/2011, 11:48 AM

## 2011-09-19 ENCOUNTER — Inpatient Hospital Stay (HOSPITAL_COMMUNITY): Payer: Medicaid Other

## 2011-09-19 DIAGNOSIS — E1149 Type 2 diabetes mellitus with other diabetic neurological complication: Secondary | ICD-10-CM

## 2011-09-19 DIAGNOSIS — F1410 Cocaine abuse, uncomplicated: Secondary | ICD-10-CM

## 2011-09-19 LAB — GLUCOSE, CAPILLARY
Glucose-Capillary: 231 mg/dL — ABNORMAL HIGH (ref 70–99)
Glucose-Capillary: 201 mg/dL — ABNORMAL HIGH (ref 70–99)
Glucose-Capillary: 168 mg/dL — ABNORMAL HIGH (ref 70–99)
Glucose-Capillary: 161 mg/dL — ABNORMAL HIGH (ref 70–99)

## 2011-09-19 LAB — CBC
MCH: 32.4 pg (ref 26.0–34.0)
MCHC: 36.7 g/dL — ABNORMAL HIGH (ref 30.0–36.0)
MCV: 88.3 fL (ref 78.0–100.0)
Platelets: 223 10*3/uL (ref 150–400)

## 2011-09-19 LAB — BASIC METABOLIC PANEL WITH GFR
BUN: 10 mg/dL (ref 6–23)
CO2: 14 meq/L — ABNORMAL LOW (ref 19–32)
Calcium: 9.1 mg/dL (ref 8.4–10.5)
Chloride: 102 meq/L (ref 96–112)
Creatinine, Ser: 0.73 mg/dL (ref 0.50–1.35)
GFR calc Af Amer: >90 mL/min
GFR calc non Af Amer: >90 mL/min
Glucose, Bld: 213 mg/dL — ABNORMAL HIGH (ref 70–99)
Potassium: 4.5 meq/L (ref 3.5–5.1)
Sodium: 132 meq/L — ABNORMAL LOW (ref 135–145)

## 2011-09-19 LAB — LIPASE, BLOOD: Lipase: 21 U/L (ref 11–59)

## 2011-09-19 LAB — CLOSTRIDIUM DIFFICILE BY PCR: Toxigenic C. Difficile by PCR: NEGATIVE

## 2011-09-19 LAB — LACTIC ACID, PLASMA: Lactic Acid, Venous: 1.5 mmol/L (ref 0.5–2.2)

## 2011-09-19 NOTE — Progress Notes (Signed)
Subjective:  Patient does not feel good.  Has had constant nausea but no vomiting. Has some loose stools. Complaints of food not tasting good and decreased appetite. No fever or chills. No chest pain or SOB. His paralysis has not improved.  Objective: Vital signs in last 24 hours: Filed Vitals:   09/18/11 2241 09/19/11 0159 09/19/11 0513 09/19/11 0935  BP: 146/82 124/73 154/93 120/81  Pulse: 81 78 79 72  Temp: 98.8 F (37.1 C) 98.6 F (37 C) 98.5 F (36.9 C) 98.4 F (36.9 C)  TempSrc: Oral Oral Oral   Resp: 20 20 18 16   Height:      Weight:      SpO2: 96% 95% 99% 97%     Weight change:   Intake/Output Summary (Last 24 hours) at 09/19/11 1012 Last data filed at 09/19/11 0700  Gross per 24 hour  Intake    900 ml  Output      0 ml  Net    900 ml   Physical Exam: General: resting in bed, not in acute distress HEENT: PERRL, EOMI, no scleral icterus. His left eye has extra fat-like tissue in the lateral side of sclera. Cardiac: S1/S2, RRR, No murmurs, gallops or rubs Pulm: Good air movement bilaterally, Clear to auscultation bilaterally, No rales, wheezing, rhonchi or rubs. Abd: Soft,  distended, severe tenderness to palpation with rebound. Ext: No rashes or edema, 2+DP/PT pulse bilaterally  Neuro:   Mental Status: Alert, oriented, thought content appropriate.  Speech fluent without evidence of aphasia.  Able to follow commands.  Cranial Nerves: II: visual fields grossly normal, pupils equal, round, reactive to light and accommodation III,IV, VI: ptosis not present, extraocular muscles extra-ocular motions intact bilaterally V,VII: smile asymmetric, left facial droopy. facial light touch sensation normal bilaterally VIII: hearing normal bilaterally IX,X: gag reflex present XI: decreased trapezius strength on the left.   XII: tongue deviated to the left    Motor: 5/5 on the right upper and lower extremities. 0/5 on the left upper extremity and 1/5 on the left lower  extremity. Sensory: light touch intact throughout, bilaterally Deep Tendon Reflexes: 2+ right knee reflex and 3+ left knee reflex Babinski sign: negative on both side.  Cerebellar: Normal finger-to-nose test with left hand.    Lab Results: Basic Metabolic Panel:  Basic Metabolic Panel:    Component Value Date/Time   NA 132* 09/19/2011 0500   K 4.5 09/19/2011 0500   CL 102 09/19/2011 0500   CO2 14* 09/19/2011 0500   BUN 10 09/19/2011 0500   CREATININE 0.73 09/19/2011 0500   GLUCOSE 213* 09/19/2011 0500   CALCIUM 9.1 09/19/2011 0500     Liver Function Tests:  Lab 09/13/11 1531  AST 23  ALT 23  ALKPHOS 93  BILITOT 1.1  PROT 8.4*  ALBUMIN 4.2   CBC:  CBC:    Component Value Date/Time   WBC 8.4 09/19/2011 0500   HGB 17.1* 09/19/2011 0500   HCT 46.6 09/19/2011 0500   PLT 223 09/19/2011 0500   MCV 88.3 09/19/2011 0500   NEUTROABS 7.5 09/13/2011 1531   LYMPHSABS 2.4 09/13/2011 1531   MONOABS 0.6 09/13/2011 1531   EOSABS 0.0 09/13/2011 1531   BASOSABS 0.0 09/13/2011 1531       Lab 09/19/11 0500 09/16/11 0617 09/13/11 1531  WBC 8.4 5.8 --  NEUTROABS -- -- 7.5  HGB 17.1* 14.6 --  HCT 46.6 41.1 --  MCV 88.3 87.6 --  PLT 223 213 --   Cardiac Enzymes:  Lab 09/13/11 1531  CKTOTAL 166  CKMB 2.4  CKMBINDEX --  TROPONINI <0.30   CBG:  Lab 09/19/11 0700 09/18/11 2232 09/18/11 1634 09/18/11 1130 09/18/11 0746 09/17/11 2230  GLUCAP 231* 241* 187* 170* 228* 173*   Hemoglobin A1C:  Lab 09/14/11 0635  HGBA1C 10.9*   Fasting Lipid Panel:  Lab 09/14/11 0635  CHOL 182  HDL 39*  LDLCALC 107*  TRIG 179*  CHOLHDL 4.7  LDLDIRECT --   Coagulation:  Lab 09/13/11 1531  LABPROT 12.0  INR 0.87    Drugs of Abuse     Component Value Date/Time   LABOPIA POSITIVE* 09/22/2010 1632   COCAINSCRNUR NONE DETECTED 09/22/2010 1632   LABBENZ NONE DETECTED 09/22/2010 1632   AMPHETMU NONE DETECTED 09/22/2010 1632   THCU NONE DETECTED 09/22/2010 1632   LABBARB  Value: NONE DETECTED         DRUG SCREEN FOR MEDICAL PURPOSES ONLY.  IF CONFIRMATION IS NEEDED FOR ANY PURPOSE, NOTIFY LAB WITHIN 5 DAYS.        LOWEST DETECTABLE LIMITS FOR URINE DRUG SCREEN Drug Class       Cutoff (ng/mL) Amphetamine      1000 Barbiturate      200 Benzodiazepine   200 Tricyclics       300 Opiates          300 Cocaine          300 THC              50 09/22/2010 1632     Micro Results: Recent Results (from the past 240 hour(s))  URINE CULTURE     Status: Normal   Collection Time   09/13/11  5:57 PM      Component Value Range Status Comment   Specimen Description URINE, RANDOM   Final    Special Requests NONE   Final    Setup Time [id number]   Final    Colony Count >=100,000 COLONIES/ML   Final    Culture     Final    Value: Multiple bacterial morphotypes present, none predominant. Suggest appropriate recollection if clinically indicated.   Report Status 09/14/2011 FINAL   Final      Medications:  Scheduled Meds:    . dipyridamole-aspirin  1 capsule Oral BID  . enoxaparin (LOVENOX) injection  40 mg Subcutaneous Q24H  . fentaNYL  250 mcg Intravenous Once  . insulin aspart  0-5 Units Subcutaneous QHS  . insulin aspart  0-9 Units Subcutaneous TID WC  . insulin glargine  12 Units Subcutaneous Daily  . midazolam  10 mg Intravenous Once  . simvastatin  20 mg Oral q1800  . sodium chloride  3 mL Intravenous Q12H   Continuous Infusions:    . dextrose 5 % and 0.45% NaCl 75 mL/hr at 09/18/11 1624   PRN Meds:.sodium chloride, acetaminophen, acetaminophen, benzocaine, ondansetron (ZOFRAN) IV, promethazine, senna-docusate, sodium chloride, DISCONTD: ondansetron (ZOFRAN) IV Assessment/Plan:   1) Acute right parietal CVA: patient's clinic picture is most consistent with stroke.  Head CT shows no hemorrhage. So ischemic stroke is the most likely diagnosis. Differential diagnosis includes recurrent brain meningioma, given his history of meningioma surgery in 2012. But CT scan did not show the  recurrent of brain tumor. Carotid Doppler shows No significant extracranial carotid artery stenosis demonstrated. Vertebrals are patent with antegrade flow. MRI: Acute right middle cerebral artery infarct. Postsurgical changes on the right with encephalomalacia in the right lateral temporal lobe. MRA: Severe stenosis of the right  MCA bifurcation corresponding to the acute area of infarct. There is flow distal to the stenosis. Out of the window for tPA. 2D-echo showed Normal LV size and systolic function, EF 55%. Normal RV size and systolic function. No significant valvular abnormality. The RA was mildly dilated. TEE negative.   - Appreciate Neurology's help in managing our patient.  -will continue Aggrenox for secondary prevention of stroke - PT/ OT evaluation and rehabilitation. -consulted inpatient rehab and will follow up recommendations.  2) Nausea/Abd pain and distention: Along with loose stools. Bicarb 14 today. DD- pancreatitis vs. Ischemic colitis( though BUN normal) vs C.diff vs. Gastroenteritis. WBC WNL. Afebrile. But patient does not feel well and has constant nausea. Plan: - Check lipase, lactate, C. 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 Stable postop changes per CT scan.  4) DM-II: patient A1c was 10.9 on this admission. Patient has not been on any medications at home. Glucose is 194.  -will continue lantus to 12 U daily -will continue  SSI.  5) .History of polysubstance abuse: Patient admits using cocaine.  -Social work consult.  6) DVT prophylaxis: Lovenox    LOS: 6 days   Roshun Klingensmith 09/19/2011, 10:12 AM

## 2011-09-19 NOTE — Discharge Summary (Signed)
Patient Name:  Willie Andersen  MRN: [id number]  PCP: No primary provider on file.  DOB:  [birth year]   CSN: [id number]     Date of Admission:  09/13/2011  Date of Discharge:  09/21/2011      Attending Physician: Dr. Lina Sayre, MD     DISCHARGE DIAGNOSES: 1. Principal Problem: 2.  *Stroke 3. Active Problems: 4.  Tobacco abuse 5.  GERD (gastroesophageal reflux disease) 6.  Meningioma 7.  DM-II   DISPOSITION AND FOLLOW-UP:   Willie Andersen is to follow-up with the listed providers as detailed below, in his visit, please check his blood sugar level and make adjustment of his lantus dose.  Patient needs to have PT/OT and speech therapy at SNF.   Follow-up Information    Follow up with Lorretta Harp, MD on 10/04/2011. (at 11:15 AM)    Contact information:   [address] [place] [address] [phone]         Discharge Orders    Future Appointments: Provider: Department: Dept Phone: Center:   10/05/2011 11:15 AM Lorretta Harp, MD [facility] [phone] [facility]     Future Orders Please Complete By Expires   Diet Carb Modified      Increase activity slowly      Call MD for:  temperature >100.4      Call MD for:  persistant nausea and vomiting      Call MD for:  severe uncontrolled pain      Call MD for:  difficulty breathing, headache or visual disturbances      Call MD for:  persistant dizziness or light-headedness      Call MD for:  extreme fatigue          DISCHARGE MEDICATIONS: Medication List  As of 09/21/2011  3:54 PM   TAKE these medications         acetaminophen 325 MG tablet   Commonly known as: TYLENOL   Take 2 tablets (650 mg total) by mouth every 4 (four) hours as needed (or temp >/= 99.5 F).      dipyridamole-aspirin 25-200 MG per 12 hr capsule   Commonly known as: AGGRENOX   Take 1 capsule by mouth 2 (two) times daily.      insulin glargine 100 UNIT/ML injection   Commonly known as: LANTUS   Inject 16 Units into the  skin at bedtime.      Insulin Pen Needle 31G X 5 MM Misc   Use it for insulin injection      promethazine 12.5 MG tablet   Commonly known as: PHENERGAN   Take 1 tablet (12.5 mg total) by mouth every 6 (six) hours as needed.      senna-docusate 8.6-50 MG per tablet   Commonly known as: Senokot-S   Take 1 tablet by mouth at bedtime as needed.      simvastatin 20 MG tablet   Commonly known as: ZOCOR   Take 1 tablet (20 mg total) by mouth daily at 6 PM.           CONSULTS: cardiology for TEE and Neurolgy for stroke management.     PROCEDURES PERFORMED:  Dg Chest 2 View  09/13/2011  *RADIOLOGY REPORT*  Clinical Data: Pain, weakness  CHEST - 2 VIEW  Comparison: 09/22/2010  Findings: Cardiomediastinal silhouette is stable.  No acute infiltrate or pleural effusion.  No pulmonary edema.  The bony thorax is stable.  Mild hyperinflation again  noted.  IMPRESSION: No active disease.  No significant change.  Original Report Authenticated By: Natasha Mead, M.D.   Ct Head Wo Contrast  09/13/2011  *RADIOLOGY REPORT*  Clinical Data: Code stroke  CT HEAD WITHOUT CONTRAST  Technique:  Contiguous axial images were obtained from the base of the skull through the vertex without contrast.  Comparison: 10/22/2010  Findings: Postoperative encephalomalacia is seen in the posterior right frontal lobe, the site of previous meningioma resection. Hypodensity in the high right parietal region was not  seen on the previous CT.  No evidence for acute hemorrhage, hydrocephalus, or abnormal extra-axial fluid collection.  No definite CT evidence for acute infarction.  The visualized paranasal sinuses and mastoid air cells are clear.  IMPRESSION: Hypodensity the high right parietal regions suggests acute to subacute ischemia.  No evidence for associated hemorrhage.  Postsurgical encephalomalacia in the posterior right frontal lobe.  I personally called these results to Dr. Freida Busman in the emergency department at 1552 hours on  09/13/2011.  Original Report Authenticated By: ERIC A. MANSELL, M.D.   Mr Maxine Glenn Head Wo Contrast  09/14/2011  *RADIOLOGY REPORT*  Clinical Data:  Left-sided weakness.  History of craniotomy for meningioma  MRI HEAD WITHOUT CONTRAST MRA HEAD WITHOUT CONTRAST  Technique:  Multiplanar, multiecho pulse sequences of the brain and surrounding structures were obtained without intravenous contrast. Angiographic images of the head were obtained using MRA technique without contrast.  Comparison:  CT 09/13/2011, MRI 03/25/2011  MRI HEAD  Findings:  Multiple areas of acute infarction in the right middle cerebral artery territory.  This involves the head and body of the caudate nucleus as well as the right putamen.  Patchy areas of acute infarct in the right frontal temporal and parietal lobe.  No acute infarct on the left.  Prior right sided craniotomy.  Encephalomalacia right lateral temporal lobe from prior surgical resection.  No definite recurrent tumor.  Intravenous contrast not administered for the study.  Ventricle size is normal.  Negative for hemorrhage or mass.  IMPRESSION: Acute right middle cerebral artery infarct as above.  Postsurgical changes on the right with encephalomalacia in the right lateral temporal lobe.  MRA HEAD  Findings: Both vertebral arteries are patent to the basilar. Basilar and posterior cerebral arteries are patent bilaterally. Patent posterior communicating artery bilaterally.  Internal carotid artery is patent bilaterally without stenosis. Severe stenosis in the right middle cerebral artery bifurcation corresponding to the area of acute infarct.  This may be due to atherosclerotic disease or thrombus.  Right A1 segment is hypoplastic.  Both anterior cerebral arteries are patent and supplied from the left.  Left middle cerebral artery is patent bilaterally.  Negative for cerebral aneurysm.  IMPRESSION: Severe stenosis of the right MCA bifurcation corresponding to the acute area of infarct.   There is flow distal to the stenosis.  Original Report Authenticated By: Camelia Phenes, M.D.   Mr Brain Wo Contrast  09/14/2011  *RADIOLOGY REPORT*  Clinical Data:  Left-sided weakness.  History of craniotomy for meningioma  MRI HEAD WITHOUT CONTRAST MRA HEAD WITHOUT CONTRAST  Technique:  Multiplanar, multiecho pulse sequences of the brain and surrounding structures were obtained without intravenous contrast. Angiographic images of the head were obtained using MRA technique without contrast.  Comparison:  CT 09/13/2011, MRI 03/25/2011  MRI HEAD  Findings:  Multiple areas of acute infarction in the right middle cerebral artery territory.  This involves the head and body of the caudate nucleus as well as the right putamen.  Patchy areas of acute infarct in the right frontal temporal and parietal lobe.  No acute infarct on the left.  Prior right sided craniotomy.  Encephalomalacia right lateral temporal lobe from prior surgical resection.  No definite recurrent tumor.  Intravenous contrast not administered for the study.  Ventricle size is normal.  Negative for hemorrhage or mass.  IMPRESSION: Acute right middle cerebral artery infarct as above.  Postsurgical changes on the right with encephalomalacia in the right lateral temporal lobe.  MRA HEAD  Findings: Both vertebral arteries are patent to the basilar. Basilar and posterior cerebral arteries are patent bilaterally. Patent posterior communicating artery bilaterally.  Internal carotid artery is patent bilaterally without stenosis. Severe stenosis in the right middle cerebral artery bifurcation corresponding to the area of acute infarct.  This may be due to atherosclerotic disease or thrombus.  Right A1 segment is hypoplastic.  Both anterior cerebral arteries are patent and supplied from the left.  Left middle cerebral artery is patent bilaterally.  Negative for cerebral aneurysm.  IMPRESSION: Severe stenosis of the right MCA bifurcation corresponding to the  acute area of infarct.  There is flow distal to the stenosis.  Original Report Authenticated By: Camelia Phenes, M.D.   Dg Abd Acute W/chest  09/19/2011  *RADIOLOGY REPORT*  Clinical Data: Abdominal distention  ACUTE ABDOMEN SERIES (ABDOMEN 2 VIEW & CHEST 1 VIEW)  Comparison: 01/20/2010  Findings: The lung volumes are low.  Heart size is normal.  No pleural effusion or edema.  No airspace consolidation.  There is diffuse gaseous distention of both large and small bowel loops.  Gas is identified throughout the colon up to the level of the rectum.  IMPRESSION:  1.  Nonspecific bowel gas pattern favoring ileus. 2.  No acute cardiopulmonary abnormalities.  Original Report Authenticated By: Rosealee Albee, M.D.       ADMISSION DATA:  H&P: Patient is 61 yo man with PMH of brain tumor, stroke and GERD, who presents with left side hemiparesis. Per patient, he was normal in the last night before going to bed. In this morning at about 9:30 to 10:00 AM, when he woke up and tried to put on his pants on, he realized that his left side of body was weak and could not put his pants. He fell on the floor and had urinary incontinence.  His friend found him on the ground and called EMS. Patient reports having mild headache. No vision or hearing change. No difficult in speech.  Denies fever, chills, cough, chest pain, SOB,  abdominal pain,diarrhea, constipation, dysuria, urgency, frequency, hematuria, joint pain or leg swelling.   Physical Exam: Vitals: T: 98       HR: 85       BP: 117/83       RR:18        O2 saturation:  98% at RA.  General: resting in bed, not in acute distress HEENT: PERRL, EOMI, no scleral icterus. His left eye has extra fat-like tissue in the lateral side of sclera. Cardiac: S1/S2, RRR, No murmurs, gallops or rubs Pulm: Good air movement bilaterally, Clear to auscultation bilaterally, No rales, wheezing, rhonchi or rubs. Abd: Soft,  nondistended, nontender, no rebound pain, no  organomegaly, BS present Ext: No rashes or edema, 2+DP/PT pulse bilaterally Neuro:   Mental Status: Alert, oriented, thought content appropriate.  Speech fluent without evidence of aphasia.  Able to follow commands.  Cranial Nerves: II: visual fields grossly normal, pupils equal, round, reactive to light and  accommodation III,IV, VI: ptosis not present, extraocular muscles extra-ocular motions intact bilaterally V,VII: smile asymmetric, left facial droopy. facial light touch sensation normal bilaterally VIII: hearing normal bilaterally IX,X: gag reflex present XI: decreased trapezius strength on the left.   XII: tongue deviated to the left    Motor: 5/5 on the right upper and lower extremities. 0/5 on the left upper extremity and 1/5 on the left lower extremity. Sensory: light touch intact throughout, bilaterally Deep Tendon Reflexes: 2+ right knee reflex and 3+ left knee reflex Babinski sign: negative on both side.  Cerebellar: Normal finger-to-nose test with left hand.   Lab results: Basic Metabolic Panel:  Basename  09/13/11 1539  09/13/11 1531   NA  136  132*   K  4.5  4.4   CL  101  96   CO2  --  23   GLUCOSE  280*  283*   BUN  5*  6   CREATININE  1.10  0.93   CALCIUM  --  10.4   MG  --  --   PHOS  --  --    Liver Function Tests:  [facility]  09/13/11 1531   AST  23   ALT  23   ALKPHOS  93   BILITOT  1.1   PROT  8.4*   ALBUMIN  4.2    CBC:  Basename  09/13/11 1539  09/13/11 1531   WBC  --  10.6*   NEUTROABS  --  7.5   HGB  18.4*  17.8*   HCT  54.0*  48.9   MCV  --  87.2   PLT  --  260    Cardiac Enzymes:  Basename  09/13/11 1531   CKTOTAL  166   CKMB  2.4   CKMBINDEX  --   TROPONINI  <0.30      Basename  09/13/11 1531   LABPROT  12.0   INR  0.87    HOSPITAL COURSE:  1) Acute right parietal CVA: patient's clinic picture is most consistent with stroke.  Head CT shows no hemorrhage. So ischemic stroke is the most likely diagnosis. Differential  diagnosis includes recurrent brain meningioma, given his history of meningioma surgery in 2012. But CT scan did not show the recurrent of brain tumor. MRI: Acute right middle cerebral artery infarct. Postsurgical changes on the right with encephalomalacia in the right lateral temporal lobe. 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 There is flow distal to the stenosis. On admission,  patient was out of the window for tPA. Therefore, the treatment has been focused on risk stratification and secondary prevention. Carotid Doppler shows no significant extracranial carotid artery stenosis demonstrated. Vertebrals are patent with antegrade flow.  2D-echo showed Normal LV size and systolic function, EF 55%. Normal RV size and systolic function. No significant valvular abnormality. The RA was mildly dilated. TEE is negative for PFO. Neurology was consulted for best management of patient. Patient was initially treated with ASA and switched to Aggrenox for secondary prevention of stroke. PT/OT was consulted. Patient is not qualified for  Inpatient rehab. Per PT's recommendation, patient can be discharged to SNF and continue PT/OT and speech therapy.   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 Stable postop changes per CT scan.  3) DM-II: patient A1c was 10.9 on this admission. Patient has not been on any medications at home. Patient has been treated with Lantu  12 U daily and SSi. His blood sugar was well controlled in the hospital. He will be discharged on Lantus 16 Units daily.   4) .History of polysubstance abuse: Patient admits using cocaine on admission. Social work was consulted.   DISCHARGE DATA: Vital Signs: BP 127/81  Pulse 73  Temp(Src) 98.4 F (36.9 C) (Oral)  Resp 20  Ht 5\' 8"  (1.727 m)  Wt 188 lb (85.276 kg)  BMI 28.59 kg/m2  SpO2 98%  Labs: Results for orders placed during the hospital  encounter of 09/13/11 (from the past 24 hour(s))  GLUCOSE, CAPILLARY     Status: Abnormal   Collection Time   09/20/11  4:39 PM      Component Value Range   Glucose-Capillary 163 (*) 70 - 99 (mg/dL)  GLUCOSE, CAPILLARY     Status: Abnormal   Collection Time   09/20/11  9:31 PM      Component Value Range   Glucose-Capillary 192 (*) 70 - 99 (mg/dL)   Comment 1 Documented in Chart     Comment 2 Notify RN    GLUCOSE, CAPILLARY     Status: Abnormal   Collection Time   09/21/11  6:39 AM      Component Value Range   Glucose-Capillary 189 (*) 70 - 99 (mg/dL)   Comment 1 Documented in Chart     Comment 2 Notify RN    GLUCOSE, CAPILLARY     Status: Abnormal   Collection Time   09/21/11 11:58 AM      Component Value Range   Glucose-Capillary 213 (*) 70 - 99 (mg/dL)   Comment 1 Notify RN     Comment 2 Documented in Chart       The spent on discharging this patient is about 36 min. Signed: Lorretta Harp, MD PGY I, Internal Medicine Resident 09/21/2011, 3:54 PM

## 2011-09-20 ENCOUNTER — Encounter (HOSPITAL_COMMUNITY): Payer: Self-pay | Admitting: Internal Medicine

## 2011-09-20 DIAGNOSIS — I6330 Cerebral infarction due to thrombosis of unspecified cerebral artery: Secondary | ICD-10-CM

## 2011-09-20 LAB — BASIC METABOLIC PANEL
BUN: 8 mg/dL (ref 6–23)
Calcium: 9.1 mg/dL (ref 8.4–10.5)
Creatinine, Ser: 0.76 mg/dL (ref 0.50–1.35)
GFR calc Af Amer: >90 mL/min (ref >90)
GFR calc non Af Amer: >90 mL/min (ref >90)
Glucose, Bld: 172 mg/dL — ABNORMAL HIGH (ref 70–99)

## 2011-09-20 LAB — CBC
HCT: 46.2 % (ref 39.0–52.0)
Hemoglobin: 16.8 g/dL (ref 13.0–17.0)
MCH: 31.8 pg (ref 26.0–34.0)
MCHC: 36.4 g/dL — ABNORMAL HIGH (ref 30.0–36.0)
RDW: 12.3 % (ref 11.5–15.5)

## 2011-09-20 LAB — GLUCOSE, CAPILLARY: Glucose-Capillary: 163 mg/dL — ABNORMAL HIGH (ref 70–99)

## 2011-09-20 NOTE — Progress Notes (Signed)
I met with patient and his two sisters at bedside. He does not have needed assistance at the boarding house. His daughter works and he says cannot provide 24/7 physical assistance. His sister did work for [employer] until two years ago and lives in close proximity. He prefers to go to [facility]. Please call with questions. Pager [phone]

## 2011-09-20 NOTE — Progress Notes (Signed)
Speech Language/Pathology Dysphagia Treatment Note  Patient was observed with :  Cheree Ditto crackers with peanut butter, thin liquids  Patient was noted to have s/s of aspiration : no  Lung Sounds:  Diminished but clear Temperature: afebrile  Patient required: supervision cues to consistently follow precautions/strategies  Clinical Impression: Pt with improved masticatory effort and sensory awareness of left lateral sulcus. Despite absence of some dentition, demonstrates improved oral toleration of mechanical solids, with only supervision cues to use lingual sweep in left cheek. No clinical symptoms of airway compromise with POs, including large, successive thin liquid boluses.  Pt ready to advance diet consistency.   Recommendations:  1. Advance diet to dysphagia 3 with thin liquids 2.  Continue meds whole with puree  Pain:   none Intervention Required:   No   Goals: Progressing   Kylei Purington L. Samson Frederic, [place] CCC/SLP Pager [phone]
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
## 2011-09-20 NOTE — Progress Notes (Signed)
Subjective:  Patient feels better. His abdominal pain is minimal today. No fever or chills. No chest pain or SOB. His paralysis has not improved.  Objective: Vital signs in last 24 hours: Filed Vitals:   09/19/11 2214 09/20/11 0246 09/20/11 0520 09/20/11 1000  BP: 122/90 122/83 138/96 123/83  Pulse: 65 86 83 84  Temp: 98.3 F (36.8 C) 98.7 F (37.1 C) 98.7 F (37.1 C) 98.7 F (37.1 C)  TempSrc: Oral Oral Oral Oral  Resp: 20 20 20 18   Height:      Weight:      SpO2: 100% 97% 96% 97%     Weight change:   Intake/Output Summary (Last 24 hours) at 09/20/11 1429 Last data filed at 09/20/11 0900  Gross per 24 hour  Intake    240 ml  Output   1025 ml  Net   -785 ml   Physical Exam: General: resting in bed, not in acute distress HEENT: PERRL, EOMI, no scleral icterus. His left eye has extra fat-like tissue in the lateral side of sclera. Cardiac: S1/S2, RRR, No murmurs, gallops or rubs Pulm: Good air movement bilaterally, Clear to auscultation bilaterally, No rales, wheezing, rhonchi or rubs. Abd: Soft,  distended, mild tenderness to palpation, no rebound pain. Ext: No rashes or edema, 2+DP/PT pulse bilaterally  Neuro:   Mental Status: Alert, oriented, thought content appropriate.  Speech fluent without evidence of aphasia.  Able to follow commands.  Cranial Nerves: II: visual fields grossly normal, pupils equal, round, reactive to light and accommodation III,IV, VI: ptosis not present, extraocular muscles extra-ocular motions intact bilaterally V,VII: smile asymmetric, left facial droopy. facial light touch sensation normal bilaterally VIII: hearing normal bilaterally IX,X: gag reflex present XI: decreased trapezius strength on the left.   XII: tongue deviated to the left    Motor: 5/5 on the right upper and lower extremities. 0/5 on the left upper extremity and 1/5 on the left lower extremity. Sensory: light touch intact throughout, bilaterally Deep Tendon Reflexes: 2+  right knee reflex and 3+ left knee reflex Babinski sign: negative on both side.  Cerebellar: Normal finger-to-nose test with left hand.    Lab Results: Basic Metabolic Panel:  Basic Metabolic Panel:    Component Value Date/Time   NA 134* 09/20/2011 0720   K 4.1 09/20/2011 0720   CL 102 09/20/2011 0720   CO2 20 09/20/2011 0720   BUN 8 09/20/2011 0720   CREATININE 0.76 09/20/2011 0720   GLUCOSE 172* 09/20/2011 0720   CALCIUM 9.1 09/20/2011 0720     Liver Function Tests:  Lab 09/13/11 1531  AST 23  ALT 23  ALKPHOS 93  BILITOT 1.1  PROT 8.4*  ALBUMIN 4.2   CBC:  CBC:    Component Value Date/Time   WBC 6.9 09/20/2011 0720   HGB 16.8 09/20/2011 0720   HCT 46.2 09/20/2011 0720   PLT 254 09/20/2011 0720   MCV 87.3 09/20/2011 0720   NEUTROABS 7.5 09/13/2011 1531   LYMPHSABS 2.4 09/13/2011 1531   MONOABS 0.6 09/13/2011 1531   EOSABS 0.0 09/13/2011 1531   BASOSABS 0.0 09/13/2011 1531       Lab 09/20/11 0720 09/19/11 0500 09/13/11 1531  WBC 6.9 8.4 --  NEUTROABS -- -- 7.5  HGB 16.8 17.1* --  HCT 46.2 46.6 --  MCV 87.3 88.3 --  PLT 254 223 --   Cardiac Enzymes:  Lab 09/13/11 1531  CKTOTAL 166  CKMB 2.4  CKMBINDEX --  TROPONINI <0.30   CBG:  Lab 09/20/11 1139 09/20/11 0702 09/19/11 2120 09/19/11 1615 09/19/11 1154 09/19/11 0700  GLUCAP 196* 175* 161* 168* 201* 231*   Hemoglobin A1C:  Lab 09/14/11 0635  HGBA1C 10.9*   Fasting Lipid Panel:  Lab 09/14/11 0635  CHOL 182  HDL 39*  LDLCALC 107*  TRIG 179*  CHOLHDL 4.7  LDLDIRECT --   Coagulation:  Lab 09/13/11 1531  LABPROT 12.0  INR 0.87    Drugs of Abuse     Component Value Date/Time   LABOPIA POSITIVE* 09/22/2010 1632   COCAINSCRNUR NONE DETECTED 09/22/2010 1632   LABBENZ NONE DETECTED 09/22/2010 1632   AMPHETMU NONE DETECTED 09/22/2010 1632   THCU NONE DETECTED 09/22/2010 1632   LABBARB  Value: NONE DETECTED        DRUG SCREEN FOR MEDICAL PURPOSES ONLY.  IF CONFIRMATION IS NEEDED FOR ANY PURPOSE,  NOTIFY LAB WITHIN 5 DAYS.        LOWEST DETECTABLE LIMITS FOR URINE DRUG SCREEN Drug Class       Cutoff (ng/mL) Amphetamine      1000 Barbiturate      200 Benzodiazepine   200 Tricyclics       300 Opiates          300 Cocaine          300 THC              50 09/22/2010 1632     Micro Results: Recent Results (from the past 240 hour(s))  URINE CULTURE     Status: Normal   Collection Time   09/13/11  5:57 PM      Component Value Range Status Comment   Specimen Description URINE, RANDOM   Final    Special Requests NONE   Final    Culture  Setup Time [id number]   Final    Colony Count >=100,000 COLONIES/ML   Final    Culture     Final    Value: Multiple bacterial morphotypes present, none predominant. Suggest appropriate recollection if clinically indicated.   Report Status 09/14/2011 FINAL   Final   CLOSTRIDIUM DIFFICILE BY PCR     Status: Normal   Collection Time   09/19/11 10:56 AM      Component Value Range Status Comment   C difficile by pcr NEGATIVE  NEGATIVE  Final      Medications:  Scheduled Meds:    . dipyridamole-aspirin  1 capsule Oral BID  . enoxaparin (LOVENOX) injection  40 mg Subcutaneous Q24H  . fentaNYL  250 mcg Intravenous Once  . insulin aspart  0-5 Units Subcutaneous QHS  . insulin aspart  0-9 Units Subcutaneous TID WC  . insulin glargine  12 Units Subcutaneous Daily  . midazolam  10 mg Intravenous Once  . simvastatin  20 mg Oral q1800  . sodium chloride  3 mL Intravenous Q12H   Continuous Infusions:    . dextrose 5 % and 0.45% NaCl 75 mL/hr at 09/20/11 0134   PRN Meds:.sodium chloride, acetaminophen, acetaminophen, benzocaine, ondansetron (ZOFRAN) IV, promethazine, senna-docusate, sodium chloride Assessment/Plan:   1) Acute right parietal CVA: patient's clinic picture is most consistent with stroke.  Head CT shows no hemorrhage. So ischemic stroke is the most likely diagnosis. Differential diagnosis includes recurrent brain meningioma, given his history  of meningioma surgery in 2012. But CT scan did not show the recurrent of brain tumor. Carotid Doppler shows No significant extracranial carotid artery stenosis demonstrated. Vertebrals are patent with antegrade flow. MRI: Acute right middle cerebral  artery infarct. Postsurgical changes on the right with encephalomalacia in the right lateral temporal lobe. 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 There is flow distal to the stenosis. Out of the window for tPA. 2D-echo showed Normal LV size and systolic function, EF 55%. Normal RV size and systolic function. No significant valvular abnormality. The RA was mildly dilated. TEE negative.   - Appreciate Neurology's help in managing our patient.  -will continue Aggrenox for secondary prevention of stroke - Inpatient rehab was consulted, recommend SNF placement and PT, OT and speech therapy  2) Nausea/Abd pain and distention: improved significantly. Lactic acid normal, liapse normal.  C diff PCR. No leukocytosis. X-ray of abd showed ileus pattern.  -will monitor closely. -treat symptomatically.  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 Stable postop changes per CT scan.  4) DM-II: patient A1c was 10.9 on this admission. Patient has not been on any medications at home. Glucose is 194.  -will continue lantus to 12 U daily -will continue  SSI.  5) .History of polysubstance abuse: Patient admits using cocaine.  -Social work consult.  6) DVT prophylaxis: Lovenox  7) Placement: SW is working on finding bed for SNF.    LOS: 7 days   Lorretta Harp 09/20/2011, 2:29 PM

## 2011-09-20 NOTE — Progress Notes (Signed)
Physical Therapy Treatment Patient Details Name: GEN CLAGG MRN: [id number] DOB: 01-12-1951 Today's Date: 09/20/2011  PT Assessment/Plan  PT - Assessment/Plan Comments on Treatment Session: Pt able to progress with ambulating today ~ 10' but continues to need +2 assist with transfers.  Pt more alert this session but very flat affect with little verbalization. PT Plan: Discharge plan remains appropriate;Frequency needs to be updated PT Frequency: Min 4X/week Recommendations for Other Services: Rehab consult Follow Up Recommendations: Inpatient Rehab Equipment Recommended: Defer to next venue PT Goals  Acute Rehab PT Goals PT Goal Formulation: With patient Time For Goal Achievement: 2 weeks Pt will Roll Supine to Right Side: with min assist;with rail PT Goal: Rolling Supine to Right Side - Progress: Progressing toward goal Pt will Roll Supine to Left Side: with min assist;with rail PT Goal: Rolling Supine to Left Side - Progress: Progressing toward goal Pt will Sit at Edge of Bed: with supervision;1-2 min;with unilateral upper extremity support PT Goal: Sit at Edge Of Bed - Progress: Progressing toward goal Pt will go Sit to Supine/Side: with min assist PT Goal: Sit to Supine/Side - Progress: Progressing toward goal Pt will go Sit to Stand: with upper extremity assist;with min assist PT Goal: Sit to Stand - Progress: Progressing toward goal Pt will go Stand to Sit: with min assist;with upper extremity assist PT Goal: Stand to Sit - Progress: Progressing toward goal Pt will Transfer Bed to Chair/Chair to Bed: with +2 total assist;with cues (comment type and amount) PT Transfer Goal: Bed to Chair/Chair to Bed - Progress: Progressing toward goal Pt will Stand: with min assist;1 - 2 min PT Goal: Stand - Progress: Progressing toward goal  PT Treatment Precautions/Restrictions  Precautions Precautions: Fall Precaution Comments: Lt UE positioning to prevent subluxation- Pt currently  in good aligment Required Braces or Orthoses: No Restrictions Weight Bearing Restrictions: No Mobility (including Balance) Bed Mobility Bed Mobility: Yes Rolling Left: With rail;3: Mod assist Rolling Left Details (indicate cue type and reason): (A) to initiate roll with cues for hand placement Left Sidelying to Sit: 3: Mod assist;HOB flat;With rails Left Sidelying to Sit Details (indicate cue type and reason): (A) to elevate trunk OOB with max cues for technique and (A) LE OOB. Transfers Transfers: Yes Sit to Stand: 1: +2 Total assist;Patient percentage (comment);From bed;From chair/3-in-1 (Pt 70%) Sit to Stand Details (indicate cue type and reason): (A) to initiate transfer with max cues for han and LE placement. Manual (A) for proper LLE placement. Stand to Sit: 1: +2 Total assist;Patient percentage (comment);To chair/3-in-1 (pt 70%) Stand to Sit Details: (A) to slowly descend with manual cues to prevent LLE knee buckling. Stand Pivot Transfers: Patient percentage (comment);1: +2 Total assist (pt 70%) Stand Pivot Transfer Details (indicate cue type and reason): (A) for proper weight shifts with manual cues to advance LLE  Ambulation/Gait Ambulation/Gait: Yes Ambulation/Gait Assistance: 1: +2 Total assist;Patient percentage (comment) (pt 50%) Ambulation/Gait Assistance Details (indicate cue type and reason): (A) for proper weight shifts and manual cues to advance LLE forward and (A) to prevent LLE buckling Ambulation Distance (Feet): 8 Feet Assistive device: 2 person hand held assist Gait Pattern: Step-to pattern;Decreased step length - right;Decreased stance time - left;Left genu recurvatum Stairs: No Wheelchair Mobility Wheelchair Mobility: No  Balance Balance Assessed: Yes Static Sitting Balance Static Sitting - Balance Support: Feet supported;No upper extremity supported Static Sitting - Level of Assistance: 4: Min assist Static Sitting - Comment/# of Minutes: Initial min (A)  but able to sit  upright with supervision prior to transfers Dynamic Standing Balance Dynamic Standing - Balance Support: Right upper extremity supported;Left upper extremity supported Dynamic Standing - Level of Assistance: 1: +2 Total assist;Patient percentage (comment) (60%) Dynamic Standing - Balance Activities: Lateral lean/weight shifting Dynamic Standing - Comments: Pt performed weight shifts left/right and back to midline with cues at hips for proper weight shift amount.  Pt stood ~3 minutes x 2.  Pt able to shift with one foot in front of the other. Exercise    End of Session PT - End of Session Equipment Utilized During Treatment: Gait belt Activity Tolerance: Patient limited by fatigue;Other (comment) Patient left: in chair;with call bell in reach;with family/visitor present Nurse Communication: Mobility status for transfers General Behavior During Session: Flat affect  Lakie Mclouth 09/20/2011, 1:11 PM [phone]

## 2011-09-21 LAB — GLUCOSE, CAPILLARY
Glucose-Capillary: 189 mg/dL — ABNORMAL HIGH (ref 70–99)
Glucose-Capillary: 213 mg/dL — ABNORMAL HIGH (ref 70–99)

## 2011-09-21 MED ORDER — SENNOSIDES-DOCUSATE SODIUM 8.6-50 MG PO TABS: 1.0000 | ORAL_TABLET | Freq: Every evening | ORAL | Status: AC | PRN

## 2011-09-21 MED ORDER — ACETAMINOPHEN 325 MG PO TABS: 650.0000 mg | ORAL_TABLET | ORAL | Status: AC | PRN

## 2011-09-21 MED ORDER — ASPIRIN-DIPYRIDAMOLE ER 25-200 MG PO CP12: 1.0000 | ORAL_CAPSULE | Freq: Two times a day (BID) | ORAL | Status: AC

## 2011-09-21 MED ORDER — INSULIN PEN NEEDLE 31G X 5 MM MISC: Status: DC

## 2011-09-21 MED ORDER — INSULIN GLARGINE 100 UNIT/ML ~~LOC~~ SOLN: 16.0000 [IU] | Freq: Every day | SUBCUTANEOUS | Status: DC

## 2011-09-21 MED ORDER — SIMVASTATIN 20 MG PO TABS: 20.0000 mg | ORAL_TABLET | Freq: Every day | ORAL | Status: DC

## 2011-09-21 MED ORDER — PROMETHAZINE HCL 12.5 MG PO TABS: 12.5000 mg | ORAL_TABLET | Freq: Four times a day (QID) | ORAL | Status: AC | PRN

## 2011-09-21 NOTE — Discharge Instructions (Signed)
1. Please follow-up in the clinic with Dr. Lorretta Harp On 10/04/2011 at 11:15 AM,  [address]  [address] [place] [address], [phone]  2. Please take all medications as prescribed.  3. If you have worsening of your symptoms or new symptoms arise, please call the clinic [phone]), or go to the ER immediately if symptoms are severe     STROKE/TIA DISCHARGE INSTRUCTIONS SMOKING Cigarette smoking nearly doubles your risk of having a stroke & is the single most alterable risk factor  If you smoke or have smoked in the last 12 months, you are advised to quit smoking for your health.  Most of the excess cardiovascular risk related to smoking disappears within a year of stopping.  Ask you doctor about anti-smoking medications  [place] Quit Line: 1-800-QUIT NOW  Free Smoking Cessation Classes [phone]  CHOLESTEROL Know your levels; limit fat & cholesterol in your diet  Lipid Panel     Component Value Date/Time   CHOL 182 09/14/2011 0635   TRIG 179* 09/14/2011 0635   HDL 39* 09/14/2011 0635   CHOLHDL 4.7 09/14/2011 0635   VLDL 36 09/14/2011 0635   LDLCALC 107* 09/14/2011 0635      Many patients benefit from treatment even if their cholesterol is at goal.  Goal: Total Cholesterol (CHOL) less than 160  Goal:  Triglycerides (TRIG) less than 150  Goal:  HDL greater than 40  Goal:  LDL (LDLCALC) less than 100   BLOOD PRESSURE American Stroke Association blood pressure target is less that 120/80 mm/Hg  Your discharge blood pressure is:  BP: 142/89 mmHg  Monitor your blood pressure  Limit your salt and alcohol intake  Many individuals will require more than one medication for high blood pressure  DIABETES (A1c is a blood sugar average for last 3 months) Goal HGBA1c is under 7% (HBGA1c is blood sugar average for last 3 months)  Diabetes: {STROKE DC DIABETES:22357}    Lab Results  Component Value Date   HGBA1C 10.9* 09/14/2011     Your HGBA1c can be lowered with  medications, healthy diet, and exercise.  Check your blood sugar as directed by your physician  Call your physician if you experience unexplained or low blood sugars.  PHYSICAL ACTIVITY/REHABILITATION Goal is 30 minutes at least 4 days per week  Activity: {STROKE DC ACTIVITIES:22360} Therapies: {STROKE DC THERAPIES:22361} Return to work: ***  Activity decreases your risk of heart attack and stroke and makes your heart stronger.  It helps control your weight and blood pressure; helps you relax and can improve your mood.  Participate in a regular exercise program.  Talk with your doctor about the best form of exercise for you (dancing, walking, swimming, cycling).  DIET/WEIGHT Goal is to maintain a healthy weight  Your discharge diet is: Dysphagia *** liquids Your height is:  Height: 5\' 8"  (172.7 cm) Your current weight is: Weight: 85.4 kg (188 lb 4.4 oz) Your Body Mass Index (BMI) is:  BMI (Calculated): 28.7   Following the type of diet specifically designed for you will help prevent another stroke.  Your goal weight range is:  ***  Your goal Body Mass Index (BMI) is 19-24.  Healthy food habits can help reduce 3 risk factors for stroke:  High cholesterol, hypertension, and excess weight.  RESOURCES Stroke/Support Group:  Call [phone]   STROKE EDUCATION PROVIDED/REVIEWED AND GIVEN TO PATIENT Stroke warning signs and symptoms How to activate emergency medical system (call 911). Medications prescribed at discharge.  Need for follow-up after discharge. Personal risk factors for stroke. Pneumonia vaccine given: {STROKE DC YES/NO/DATE:22363} Flu vaccine given: {STROKE DC YES/NO/DATE:22363} My questions have been answered, the writing is legible, and I understand these instructions.  I will adhere to these goals & educational materials that have been provided to me after my discharge from the hospital.

## 2011-09-21 NOTE — Progress Notes (Signed)
Pt D/C'd to SNF via ambulance @ 1650 per MD order. Pt given D/C instructions with stroke education. Pt's belongings sent with patient including his clothes, wallet, and glasses were placed in a patient bag. Rema Fendt, RN

## 2011-09-21 NOTE — Progress Notes (Signed)
Subjective:  Patient feels fine. His nausea and abdominal resolved. No fever or chills. No chest pain or SOB. His paralysis has not improved.  Objective: Vital signs in last 24 hours: Filed Vitals:   09/20/11 1727 09/20/11 2200 09/21/11 0200 09/21/11 0600  BP: 126/82 130/70 125/80 119/89  Pulse: 80 77 78 101  Temp: 98.2 F (36.8 C) 99 F (37.2 C) 98.9 F (37.2 C) 98.8 F (37.1 C)  TempSrc: Oral Oral Oral Oral  Resp: 18 19 19 18   Height:      Weight:      SpO2: 95% 97% 98% 94%     Weight change:   Intake/Output Summary (Last 24 hours) at 09/21/11 0820 Last data filed at 09/21/11 0659  Gross per 24 hour  Intake   1670 ml  Output    600 ml  Net   1070 ml   Physical Exam: General: resting in bed, not in acute distress HEENT: PERRL, EOMI, no scleral icterus. His left eye has extra fat-like tissue in the lateral side of sclera. Cardiac: S1/S2, RRR, No murmurs, gallops or rubs Pulm: Good air movement bilaterally, Clear to auscultation bilaterally, No rales, wheezing, rhonchi or rubs. Abd: Soft,  distended, mild tenderness to palpation, no rebound pain. Ext: No rashes or edema, 2+DP/PT pulse bilaterally  Neuro:   Mental Status: Alert, oriented, thought content appropriate.  Speech fluent without evidence of aphasia.  Able to follow commands.  Cranial Nerves: II: visual fields grossly normal, pupils equal, round, reactive to light and accommodation III,IV, VI: ptosis not present, extraocular muscles extra-ocular motions intact bilaterally V,VII: smile asymmetric, left facial droopy. facial light touch sensation normal bilaterally VIII: hearing normal bilaterally IX,X: gag reflex present XI: decreased trapezius strength on the left.   XII: tongue deviated to the left    Motor: 5/5 on the right upper and lower extremities. 0/5 on the left upper extremity and 1/5 on the left lower extremity. Sensory: light touch intact throughout, bilaterally Deep Tendon Reflexes: 2+ right  knee reflex and 3+ left knee reflex Babinski sign: negative on both side.  Cerebellar: Normal finger-to-nose test with left hand.    Lab Results: Basic Metabolic Panel:  Basic Metabolic Panel:    Component Value Date/Time   NA 134* 09/20/2011 0720   K 4.1 09/20/2011 0720   CL 102 09/20/2011 0720   CO2 20 09/20/2011 0720   BUN 8 09/20/2011 0720   CREATININE 0.76 09/20/2011 0720   GLUCOSE 172* 09/20/2011 0720   CALCIUM 9.1 09/20/2011 0720     Liver Function Tests: No results found for this basename: AST:2,ALT:2,ALKPHOS:2,BILITOT:2,PROT:2,ALBUMIN:2 in the last 168 hours CBC:  CBC:    Component Value Date/Time   WBC 6.9 09/20/2011 0720   HGB 16.8 09/20/2011 0720   HCT 46.2 09/20/2011 0720   PLT 254 09/20/2011 0720   MCV 87.3 09/20/2011 0720   NEUTROABS 7.5 09/13/2011 1531   LYMPHSABS 2.4 09/13/2011 1531   MONOABS 0.6 09/13/2011 1531   EOSABS 0.0 09/13/2011 1531   BASOSABS 0.0 09/13/2011 1531       Lab 09/20/11 0720 09/19/11 0500  WBC 6.9 8.4  NEUTROABS -- --  HGB 16.8 17.1*  HCT 46.2 46.6  MCV 87.3 88.3  PLT 254 223   Cardiac Enzymes: No results found for this basename: CKTOTAL:3,CKMB:3,CKMBINDEX:3,TROPONINI:3 in the last 168 hours CBG:  Lab 09/21/11 0639 09/20/11 2131 09/20/11 1639 09/20/11 1139 09/20/11 0702 09/19/11 2120  GLUCAP 189* 192* 163* 196* 175* 161*   Drugs of Abuse  Component Value Date/Time   LABOPIA POSITIVE* 09/22/2010 1632   COCAINSCRNUR NONE DETECTED 09/22/2010 1632   LABBENZ NONE DETECTED 09/22/2010 1632   AMPHETMU NONE DETECTED 09/22/2010 1632   THCU NONE DETECTED 09/22/2010 1632   LABBARB  Value: NONE DETECTED        DRUG SCREEN FOR MEDICAL PURPOSES ONLY.  IF CONFIRMATION IS NEEDED FOR ANY PURPOSE, NOTIFY LAB WITHIN 5 DAYS.        LOWEST DETECTABLE LIMITS FOR URINE DRUG SCREEN Drug Class       Cutoff (ng/mL) Amphetamine      1000 Barbiturate      200 Benzodiazepine   200 Tricyclics       300 Opiates          300 Cocaine          300 THC              50  09/22/2010 1632     Micro Results: Recent Results (from the past 240 hour(s))  URINE CULTURE     Status: Normal   Collection Time   09/13/11  5:57 PM      Component Value Range Status Comment   Specimen Description URINE, RANDOM   Final    Special Requests NONE   Final    Culture  Setup Time [id number]   Final    Colony Count >=100,000 COLONIES/ML   Final    Culture     Final    Value: Multiple bacterial morphotypes present, none predominant. Suggest appropriate recollection if clinically indicated.   Report Status 09/14/2011 FINAL   Final   CLOSTRIDIUM DIFFICILE BY PCR     Status: Normal   Collection Time   09/19/11 10:56 AM      Component Value Range Status Comment   C difficile by pcr NEGATIVE  NEGATIVE  Final      Medications:  Scheduled Meds:    . dipyridamole-aspirin  1 capsule Oral BID  . enoxaparin (LOVENOX) injection  40 mg Subcutaneous Q24H  . fentaNYL  250 mcg Intravenous Once  . insulin aspart  0-5 Units Subcutaneous QHS  . insulin aspart  0-9 Units Subcutaneous TID WC  . insulin glargine  12 Units Subcutaneous Daily  . midazolam  10 mg Intravenous Once  . simvastatin  20 mg Oral q1800  . sodium chloride  3 mL Intravenous Q12H   Continuous Infusions:    . dextrose 5 % and 0.45% NaCl 75 mL/hr at 09/21/11 0704   PRN Meds:.sodium chloride, acetaminophen, acetaminophen, benzocaine, ondansetron (ZOFRAN) IV, promethazine, senna-docusate, sodium chloride Assessment/Plan:   1) Acute right parietal CVA: patient's clinic picture is most consistent with stroke.  Head CT shows no hemorrhage. So ischemic stroke is the most likely diagnosis. Differential diagnosis includes recurrent brain meningioma, given his history of meningioma surgery in 2012. But CT scan did not show the recurrent of brain tumor. Carotid Doppler shows No significant extracranial carotid artery stenosis demonstrated. Vertebrals are patent with antegrade flow. MRI: Acute right middle cerebral artery  infarct. Postsurgical changes on the right with encephalomalacia in the right lateral temporal lobe. 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 There is flow distal to the stenosis. Out of the window for tPA. 2D-echo showed Normal LV size and systolic function, EF 55%. Normal RV size and systolic function. No significant valvular abnormality. The RA was mildly dilated. TEE negative.   - Appreciate Neurology's help in managing our patient.  -will  continue Aggrenox for secondary prevention of stroke - Inpatient rehab was consulted, recommend SNF placement and PT, OT and speech therapy -will d/c to SNF when bed is available  2) Nausea/Abd pain and distention:  resolved. Lactic acid normal, liapse normal.  C diff PCR. No leukocytosis. X-ray of abd showed ileus pattern.  -will monitor closely.  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 Stable postop changes per CT scan.  4) DM-II: patient A1c was 10.9 on this admission. Patient has not been on any medications at home. Glucose is 194.  -will continue lantus to 12 U daily -will continue  SSI.  5) .History of polysubstance abuse: Patient admits using cocaine.  -Social work consult.  6) DVT prophylaxis: Lovenox  7) Placement: SW is working on finding bed for SNF.    LOS: 8 days   Lorretta Harp 09/21/2011, 8:20 AM

## 2011-09-21 NOTE — Progress Notes (Signed)
Physical Therapy Treatment Patient Details Name: Willie Andersen MRN: [id number] DOB: [birth year] Today's Date: 09/21/2011  PT Assessment/Plan  PT - Assessment/Plan Comments on Treatment Session: Pt able to increase ambulation distance but continues to need max cues for activations.  Pt with very flat affect and little verbalization but willing to work.   PT Plan: Discharge plan needs to be updated;Frequency needs to be updated PT Frequency: Min 4X/week Follow Up Recommendations: Skilled nursing facility (Inpatient Rehab unable to accept pt) Equipment Recommended: Defer to next venue PT Goals  Acute Rehab PT Goals PT Goal Formulation: With patient Time For Goal Achievement: 2 weeks Pt will Roll Supine to Right Side: with min assist;with rail PT Goal: Rolling Supine to Right Side - Progress: Progressing toward goal Pt will Roll Supine to Left Side: with min assist;with rail PT Goal: Rolling Supine to Left Side - Progress: Progressing toward goal Pt will go Sit to Supine/Side: with min assist PT Goal: Sit to Supine/Side - Progress: Progressing toward goal Pt will go Sit to Stand: with upper extremity assist;with min assist PT Goal: Sit to Stand - Progress: Progressing toward goal Pt will go Stand to Sit: with min assist;with upper extremity assist PT Goal: Stand to Sit - Progress: Progressing toward goal Pt will Stand: with min assist;1 - 2 min PT Goal: Stand - Progress: Progressing toward goal Pt will Ambulate: 16 - 50 feet;with +2 total assist (pt 60%) PT Goal: Ambulate - Progress: Progressing toward goal  PT Treatment Precautions/Restrictions  Precautions Precautions: Fall Precaution Comments: Lt UE positioning to prevent subluxation- Pt currently in good aligment Required Braces or Orthoses: No Restrictions Weight Bearing Restrictions: No Mobility (including Balance) Bed Mobility Bed Mobility: Yes Rolling Left: With rail;3: Mod assist Rolling Left Details (indicate cue type  and reason): (A) to initiate roll with cues for technique. Left Sidelying to Sit: 3: Mod assist;HOB flat;With rails Left Sidelying to Sit Details (indicate cue type and reason): (A) to elevate trunk OOB with max cues for technique and max encouragement. Transfers Transfers: Yes Sit to Stand: 1: +2 Total assist;Patient percentage (comment);From bed;From chair/3-in-1 (Pt 60^) Sit to Stand Details (indicate cue type and reason): (A) to initiate transfer with RUE secured in gait belt.  Pt able to increase assistance amount with less cues for technique. Stand to Sit: 1: +2 Total assist;Patient percentage (comment);To chair/3-in-1 (pt 60%) Stand to Sit Details: (A) to slowly descend to recliner with cues for hand and LE placement.  Performed sit-> stand x 3  Ambulation/Gait Ambulation/Gait: Yes Ambulation/Gait Assistance: 1: +2 Total assist;Patient percentage (comment) (pt 50%) Ambulation/Gait Assistance Details (indicate cue type and reason): (A) to maintain balance and promote proper weight shift Left and right.  Manual cues to advance LLE.  Pt able to show slow activation of LLE in hip flexors to intiate knee bend to advance LE.   Ambulation Distance (Feet): 16 Feet (8 x 2) Assistive device: 2 person hand held assist Gait Pattern: Step-to pattern;Decreased step length - right;Decreased stance time - left;Left genu recurvatum  Balance Balance Assessed: Yes Static Standing Balance Static Standing - Level of Assistance:  (pt 50%) Dynamic Standing Balance Dynamic Standing - Level of Assistance: 1: +2 Total assist;Patient percentage (comment) Dynamic Standing - Balance Activities: Lateral lean/weight shifting Dynamic Standing - Comments: Pt tends to loose balance to left due to LLE weakness. Pt continues to activate hip flexors and knee extensors Exercise    End of Session PT - End of Session Equipment Utilized During Treatment:  Gait belt Activity Tolerance: Patient limited by fatigue;Other  (comment) Patient left: in chair;with call bell in reach;with family/visitor present Nurse Communication: Mobility status for transfers General Behavior During Session: Flat affect  Janera Peugh 09/21/2011, 1:38 PM [phone]

## 2011-09-22 NOTE — Progress Notes (Signed)
Clinical Social Work-CSW facilitated pt d/c to [facility] (patients only bed offer)  with Letter of Guarantee as supportive measure for Medicaid payment-CSW notified pt daughter per pt request and arranged [facility] transport. No further needs at this time. Jodean Lima, [phone]

## 2011-10-05 ENCOUNTER — Encounter: Payer: Medicaid Other | Admitting: Internal Medicine

## 2011-10-07 ENCOUNTER — Encounter: Payer: Self-pay | Admitting: Internal Medicine

## 2012-10-31 IMAGING — CR DG CHEST 2V
2 series · 2 of 2 positions shown · non-contrast
Comparison: 09/08/2010

CLINICAL DATA: Syncope.  Headache.  4 days  postop from right
craniotomy.

CHEST - 2 VIEW

[w chest lat * (1 of 2)]
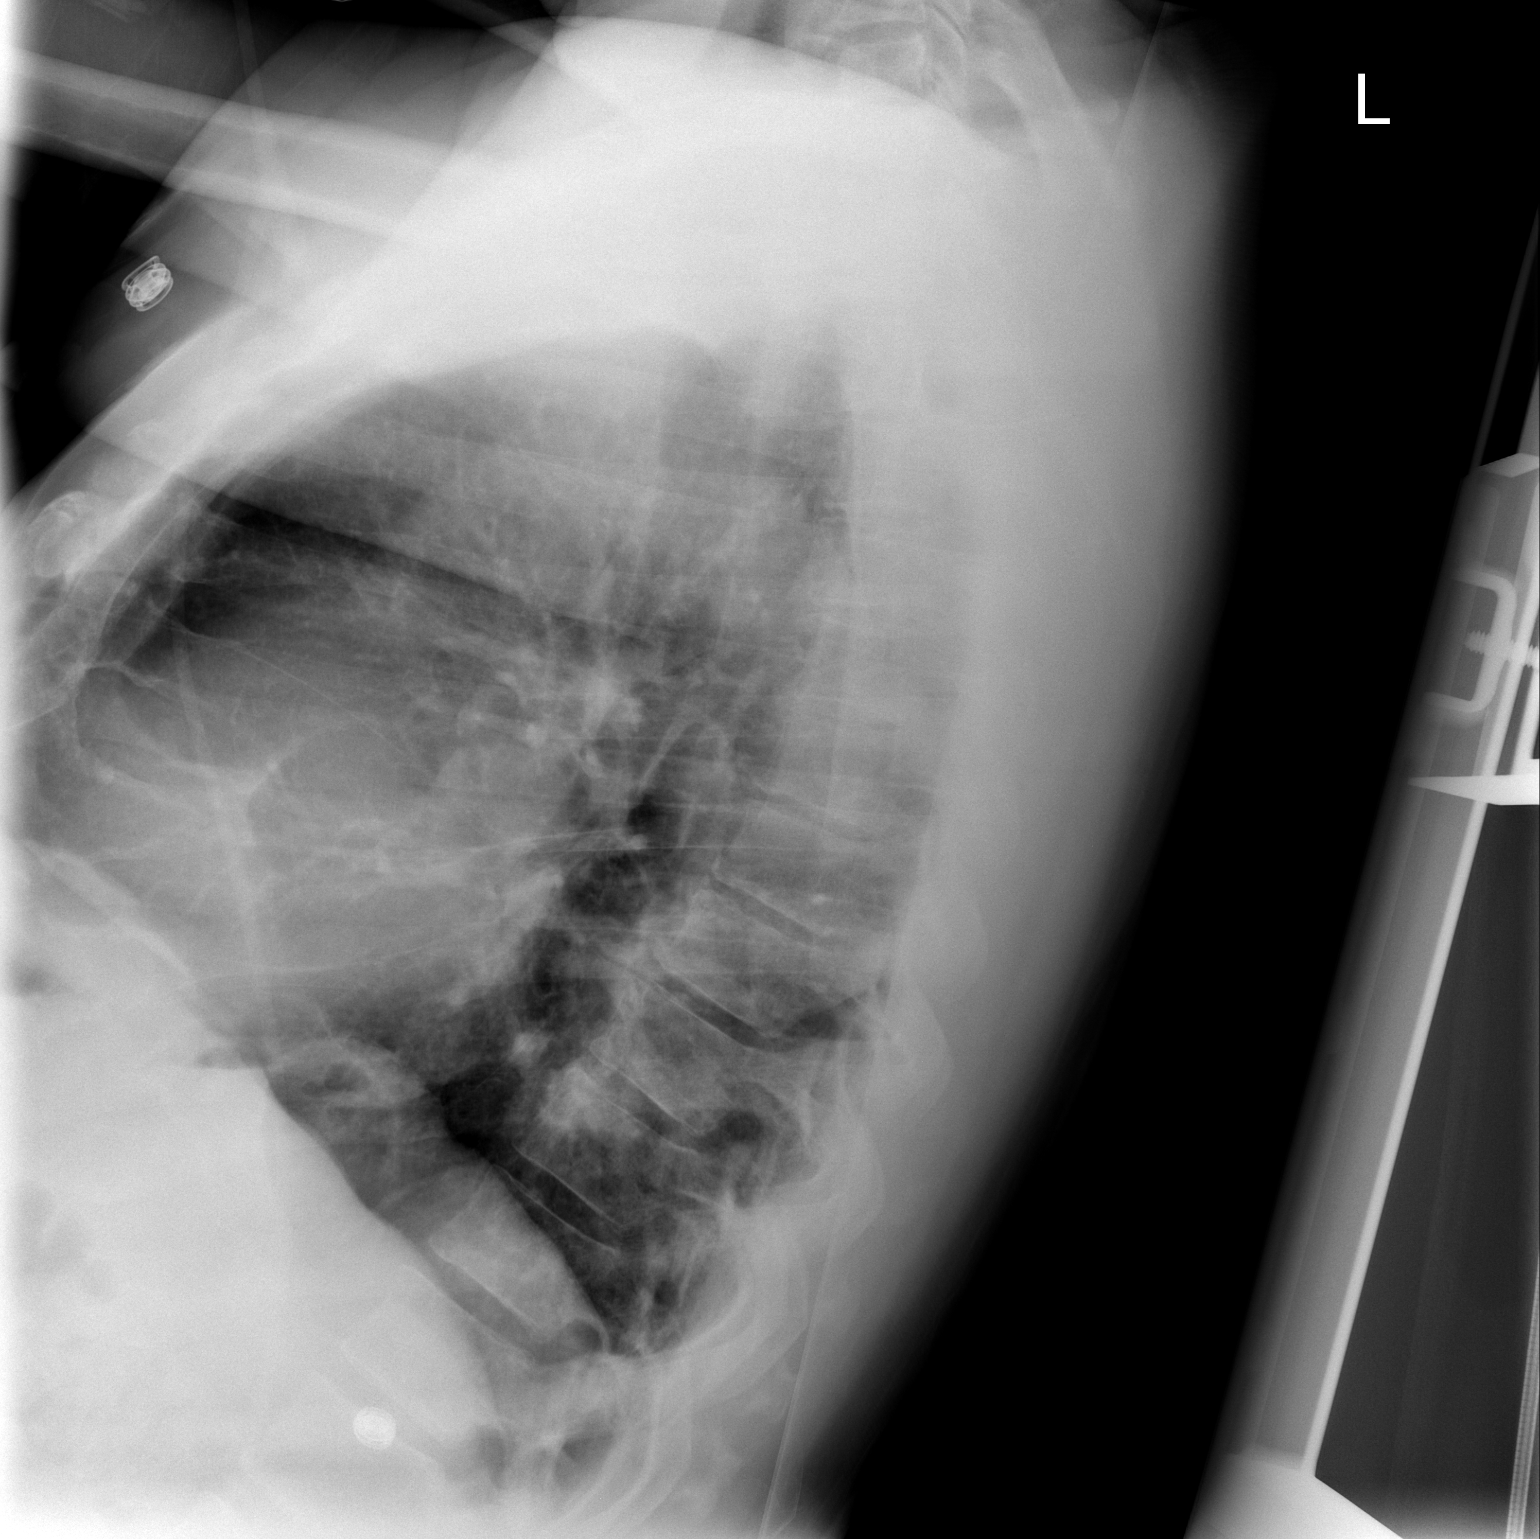

[w chest lat * (2 of 2)]
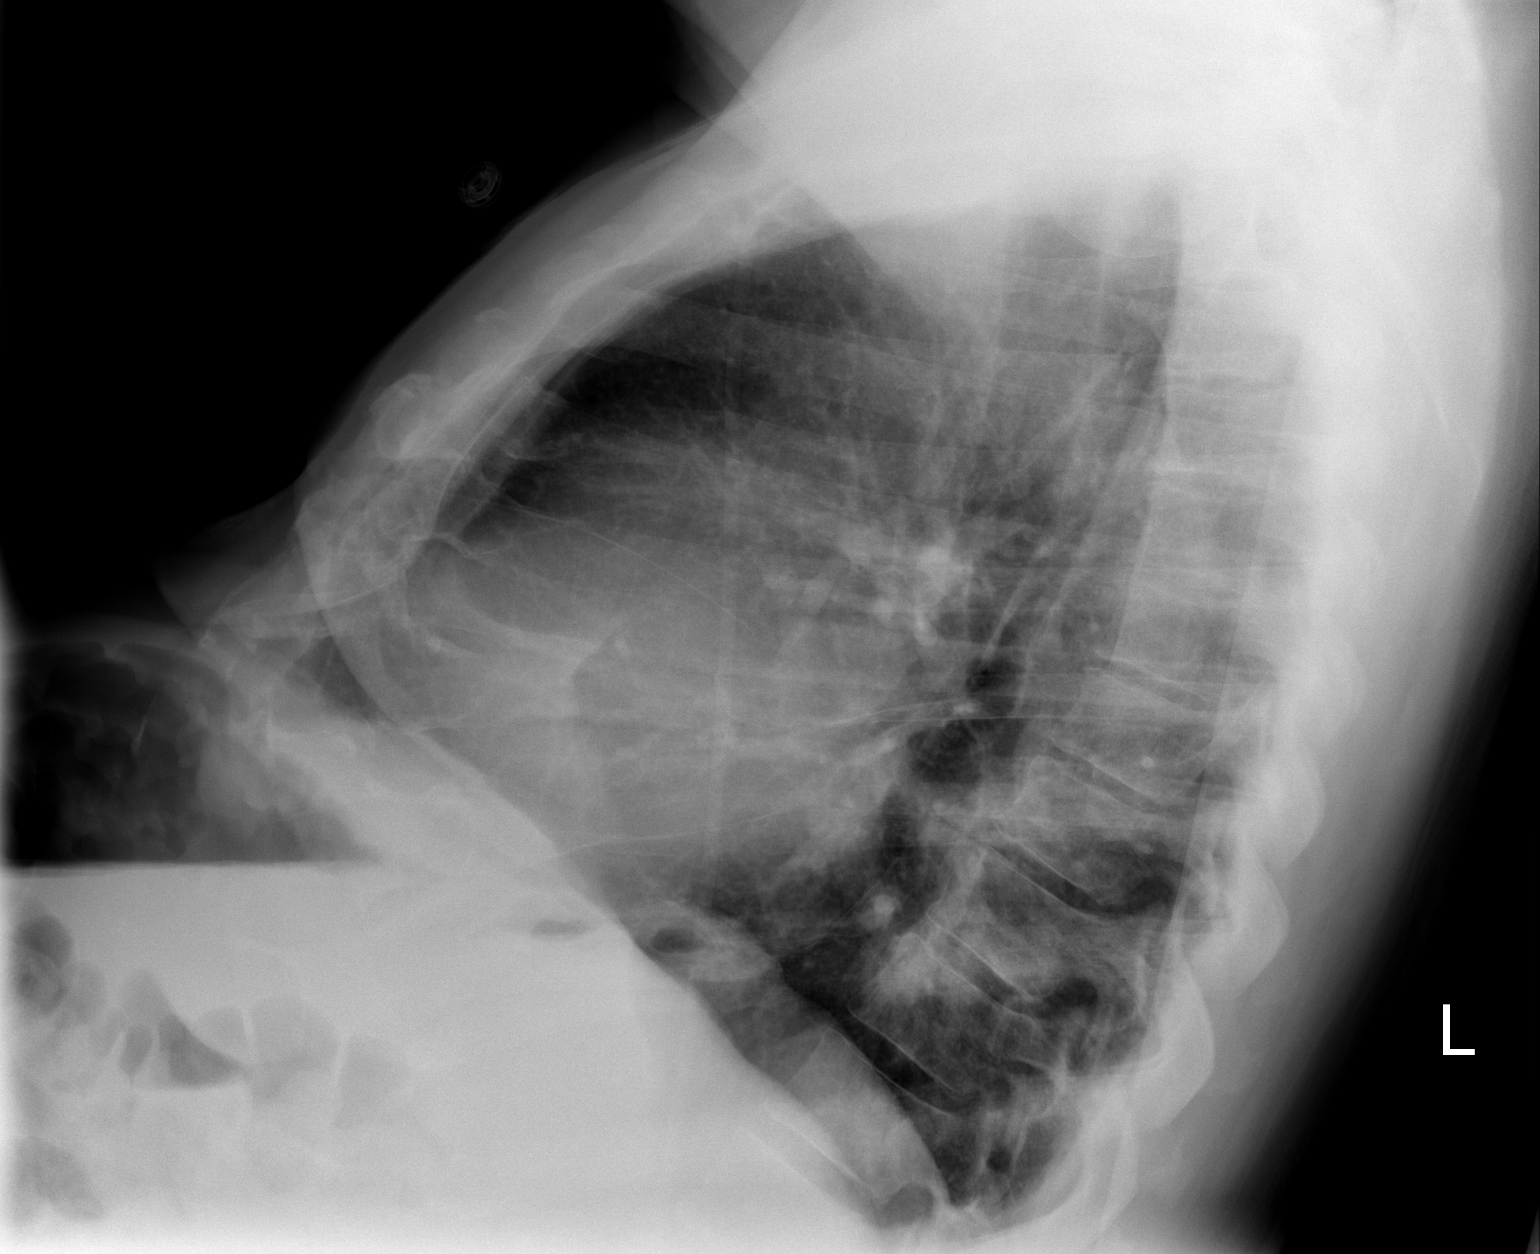

[2 of 2 positions shown; findings below may reference images not displayed]

FINDINGS: Mild cardiomegaly stable.  Both lungs are clear.  Mild
hyperinflation is suspicious for COPD.  No evidence of pleural
effusion.  No mass or adenopathy identified.
IMPRESSION: Stable mild cardiomegaly and probable COPD.  No acute findings.

## 2012-10-31 IMAGING — CT CT HEAD W/O CM
1 of 2 series · 13 of 30 positions shown, 17 images · non-contrast
Comparison: 09/22/2010

CLINICAL DATA: Postop from craniotomy.  Acute mental status change
and nausea and vomiting.

CT HEAD WITHOUT CONTRAST
TECHNIQUE: Contiguous axial images were obtained from the base of
the skull through the vertex without intravenous contrast.

[Series 2: brain · axial · 0.49mm/px · z∈[+143,+275]mm · 13 of 32 slices shown, 17 images]
[im 3/32  brain]
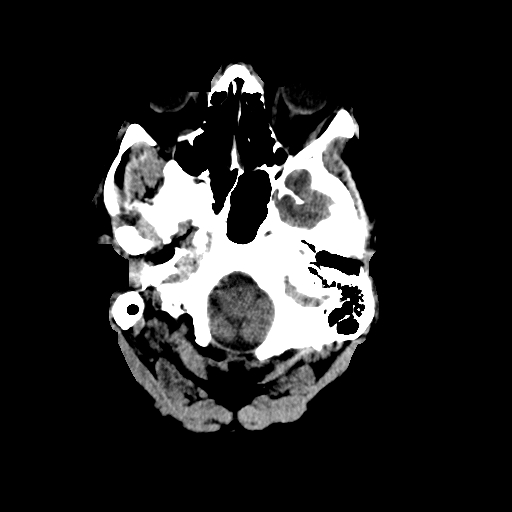
[im 3/32  bone]
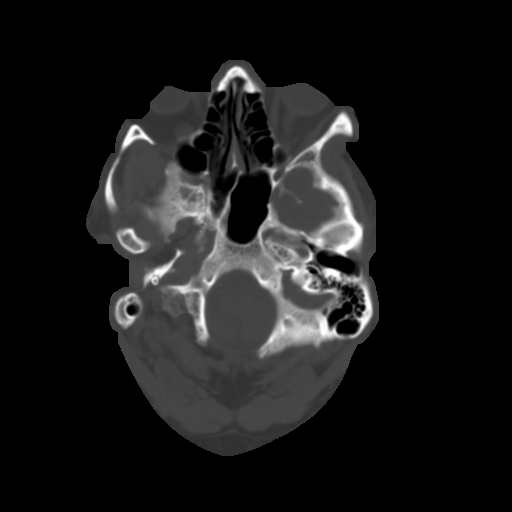
[im 5/32  brain]
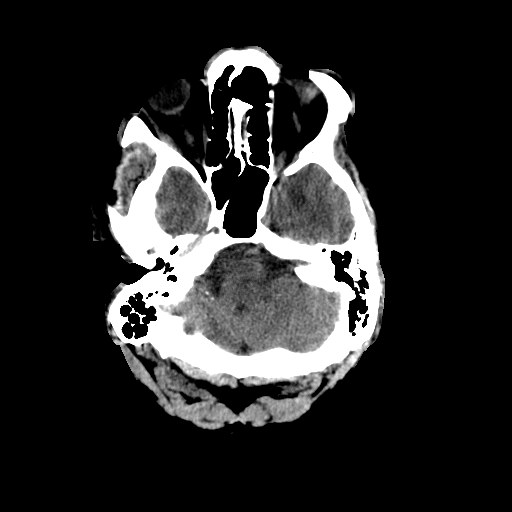
[im 7/32  brain]
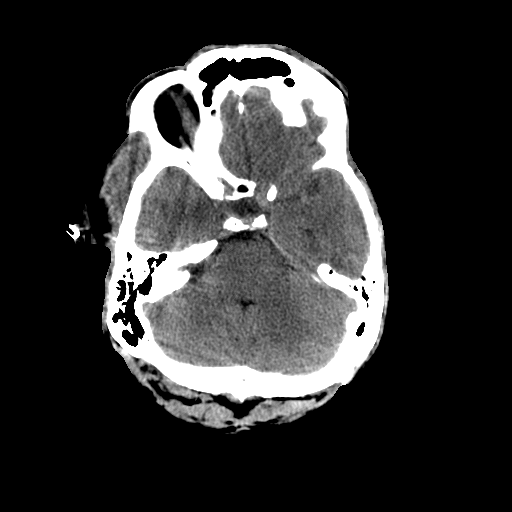
[im 9/32  brain]
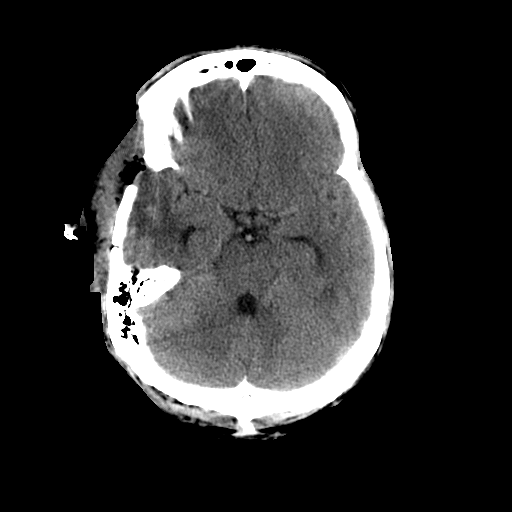
[im 12/32  brain]
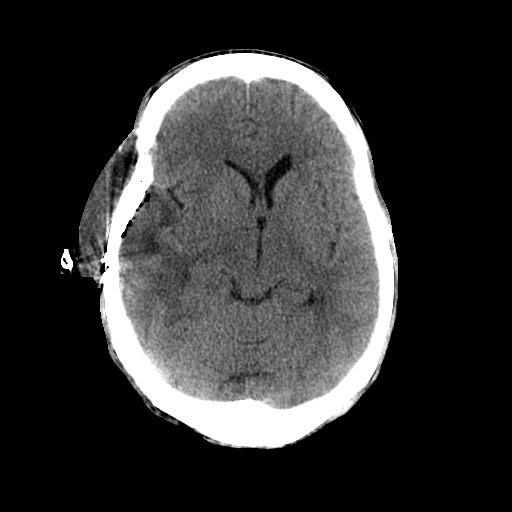
[im 12/32  bone]
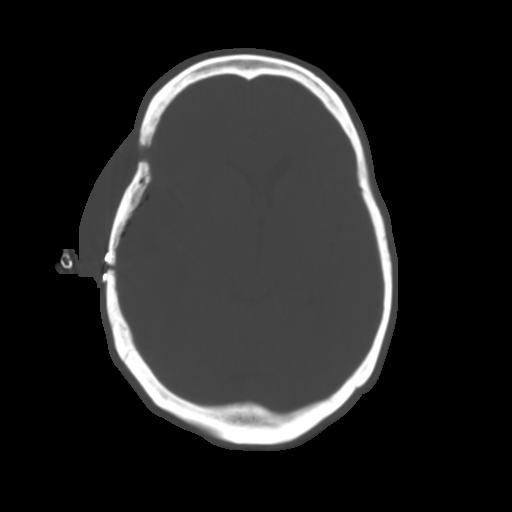
[im 14/32  brain]
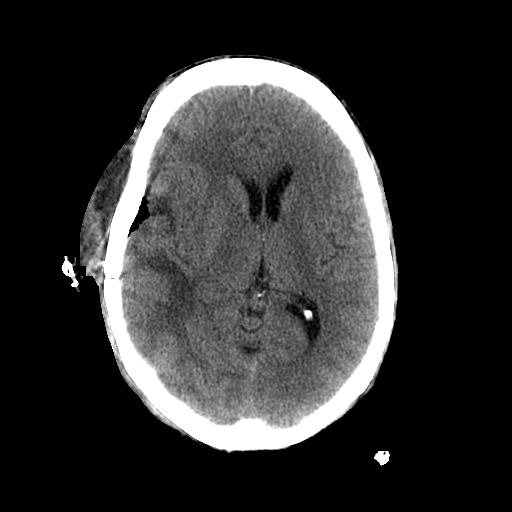
[im 16/32  brain]
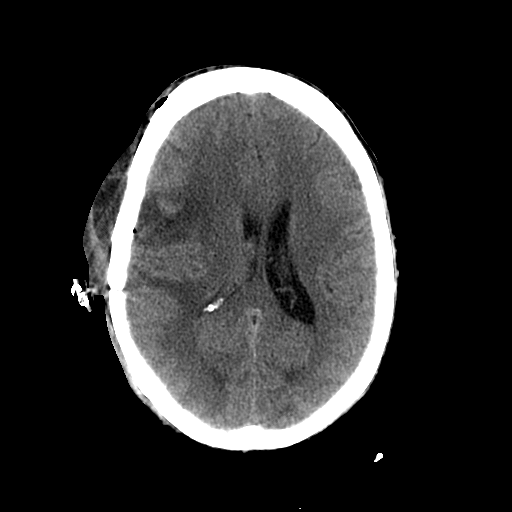
[im 18/32  brain]
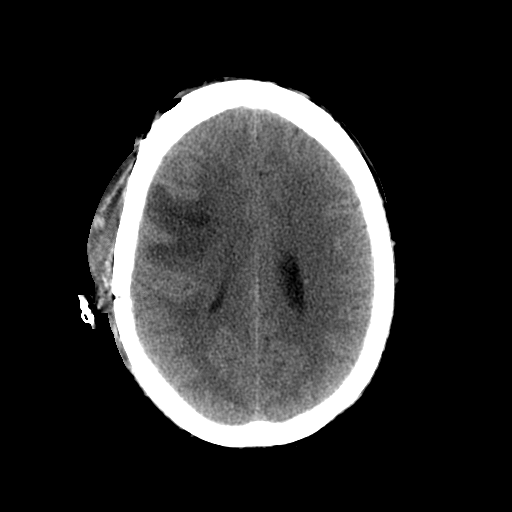
[im 20/32  brain]
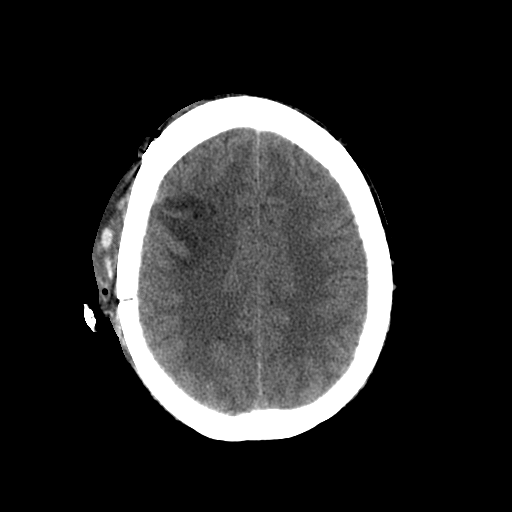
[im 20/32  bone]
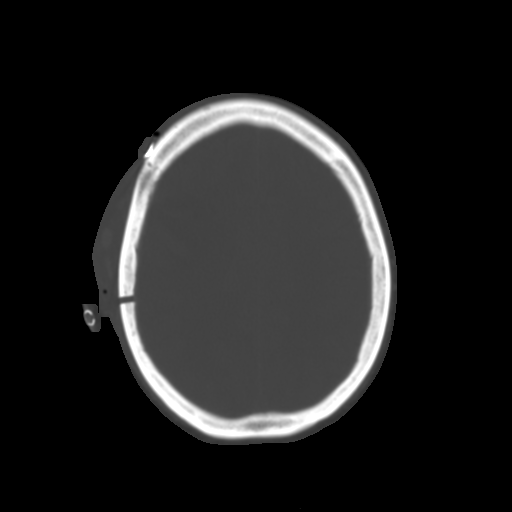
[im 23/32  brain]
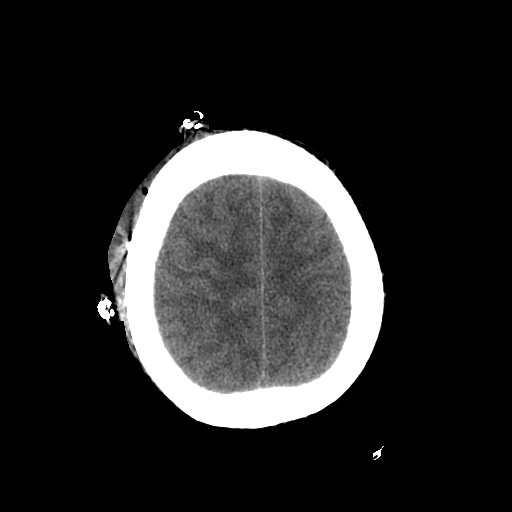
[im 25/32  brain]
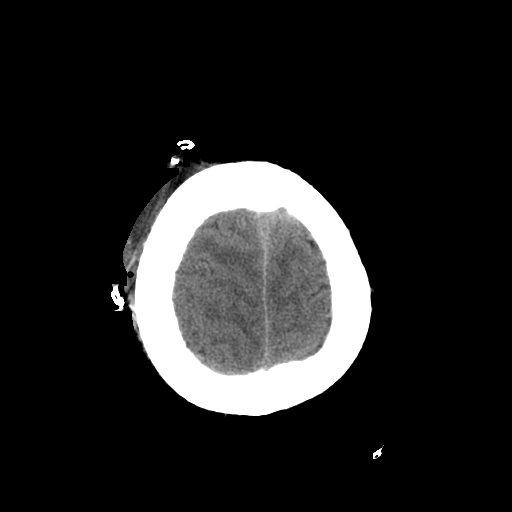
[im 27/32  brain]
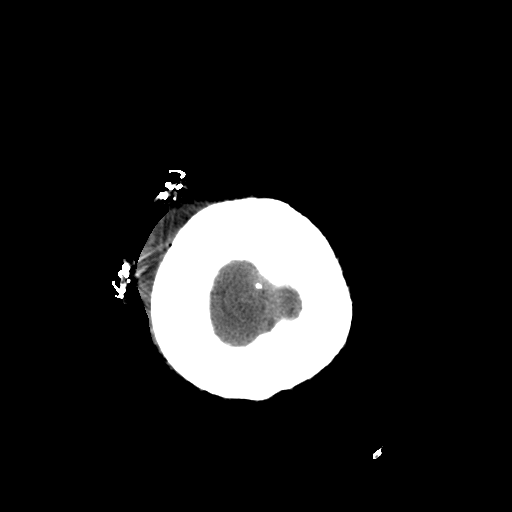
[im 29/32  brain]
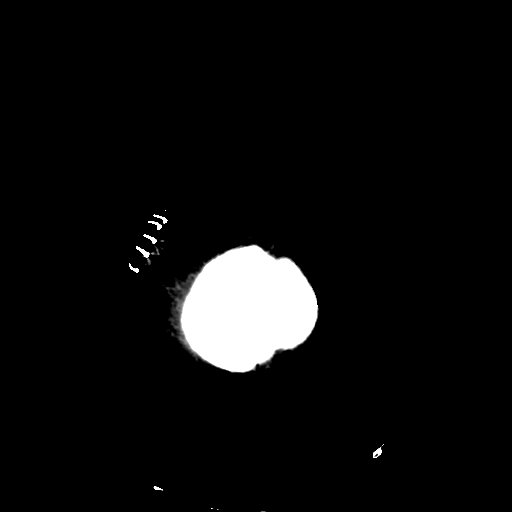
[im 29/32  bone]
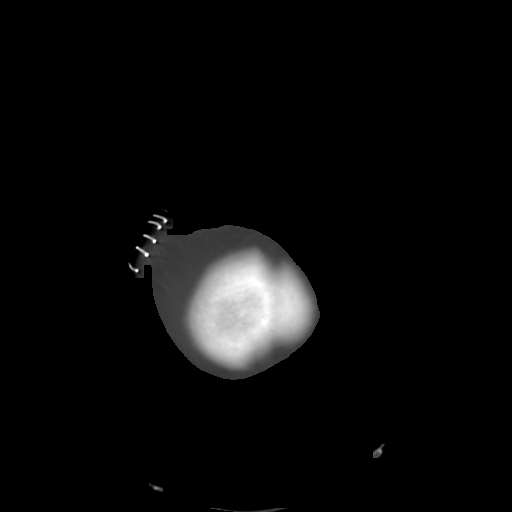

[13 of 30 positions shown; findings below may reference images not displayed]

FINDINGS: Right craniotomy site and mild extra-axial gas in the
right temporal region show no significant change.  No evidence of
acute intracranial hemorrhage.

Vasogenic edema in the right frontal and temporal regions shows no
significant change.  Mass effect with right to midline shift
measuring 6 mm shows no significant change.  Ventricles remain
stable in size.
IMPRESSION: 1.  No significant change in vasogenic edema in the right frontal
and temporal lobes with mild right to left midline shift.
2.  No significant change in mild right temporal extra-axial gas at
craniotomy site.
3.  No acute intracranial hemorrhage.

## 2016-03-16 ENCOUNTER — Encounter: Payer: Self-pay | Admitting: Internal Medicine

## 2016-03-18 ENCOUNTER — Encounter: Payer: Self-pay | Admitting: Gastroenterology

## 2016-04-01 ENCOUNTER — Emergency Department (HOSPITAL_COMMUNITY)
Admission: EM | Admit: 2016-04-01 | Discharge: 2016-04-02 | Disposition: A | Discharge status: Home / Self Care | Payer: Medicare HMO | Attending: Emergency Medicine | Admitting: Emergency Medicine

## 2016-04-01 ENCOUNTER — Encounter (HOSPITAL_COMMUNITY): Payer: Self-pay

## 2016-04-01 DIAGNOSIS — E1165 Type 2 diabetes mellitus with hyperglycemia: Secondary | ICD-10-CM | POA: Diagnosis not present

## 2016-04-01 DIAGNOSIS — R531 Weakness: Secondary | ICD-10-CM | POA: Diagnosis present

## 2016-04-01 DIAGNOSIS — Z79899 Other long term (current) drug therapy: Secondary | ICD-10-CM | POA: Diagnosis not present

## 2016-04-01 DIAGNOSIS — F1490 Cocaine use, unspecified, uncomplicated: Secondary | ICD-10-CM | POA: Diagnosis not present

## 2016-04-01 DIAGNOSIS — Z8673 Personal history of transient ischemic attack (TIA), and cerebral infarction without residual deficits: Secondary | ICD-10-CM | POA: Insufficient documentation

## 2016-04-01 DIAGNOSIS — Z794 Long term (current) use of insulin: Secondary | ICD-10-CM | POA: Diagnosis not present

## 2016-04-01 DIAGNOSIS — R739 Hyperglycemia, unspecified: Secondary | ICD-10-CM

## 2016-04-01 DIAGNOSIS — Z85841 Personal history of malignant neoplasm of brain: Secondary | ICD-10-CM | POA: Insufficient documentation

## 2016-04-01 DIAGNOSIS — F17210 Nicotine dependence, cigarettes, uncomplicated: Secondary | ICD-10-CM | POA: Insufficient documentation

## 2016-04-01 LAB — CBC WITH DIFFERENTIAL/PLATELET
BASOS PCT: 0 %
Basophils Absolute: 0 10*3/uL (ref 0.0–0.1)
EOS ABS: 0 10*3/uL (ref 0.0–0.7)
EOS PCT: 1 %
HCT: 38.7 % — ABNORMAL LOW (ref 39.0–52.0)
Hemoglobin: 13.9 g/dL (ref 13.0–17.0)
Lymphocytes Relative: 40 %
Lymphs Abs: 2 10*3/uL (ref 0.7–4.0)
MCH: 29.8 pg (ref 26.0–34.0)
MCHC: 35.9 g/dL (ref 30.0–36.0)
MCV: 83 fL (ref 78.0–100.0)
MONO ABS: 0.3 10*3/uL (ref 0.1–1.0)
MONOS PCT: 5 %
NEUTROS PCT: 54 %
Neutro Abs: 2.8 10*3/uL (ref 1.7–7.7)
PLATELETS: 271 10*3/uL (ref 150–400)
RBC: 4.66 MIL/uL (ref 4.22–5.81)
RDW: 12.5 % (ref 11.5–15.5)
WBC: 5.1 10*3/uL (ref 4.0–10.5)

## 2016-04-01 LAB — BASIC METABOLIC PANEL
Anion gap: 12 (ref 5–15)
CALCIUM: 7.9 mg/dL — AB (ref 8.9–10.3)
CO2: 21 mmol/L — AB (ref 22–32)
CREATININE: 0.55 mg/dL — AB (ref 0.61–1.24)
Chloride: 102 mmol/L (ref 101–111)
GFR calc non Af Amer: >60 mL/min (ref >60)
Glucose, Bld: 320 mg/dL — ABNORMAL HIGH (ref 65–99)
Potassium: 3.1 mmol/L — ABNORMAL LOW (ref 3.5–5.1)
Sodium: 135 mmol/L (ref 135–145)

## 2016-04-01 LAB — URINALYSIS, ROUTINE W REFLEX MICROSCOPIC
BILIRUBIN URINE: NEGATIVE
Glucose, UA: >1000 mg/dL — AB
HGB URINE DIPSTICK: NEGATIVE
KETONES UR: NEGATIVE mg/dL
Leukocytes, UA: NEGATIVE
NITRITE: NEGATIVE
PROTEIN: NEGATIVE mg/dL
Specific Gravity, Urine: 1.012 (ref 1.005–1.030)
pH: 7 (ref 5.0–8.0)

## 2016-04-01 LAB — URINE MICROSCOPIC-ADD ON
RBC / HPF: NONE SEEN RBC/hpf (ref 0–5)
WBC UA: NONE SEEN WBC/hpf (ref 0–5)

## 2016-04-01 LAB — CBG MONITORING, ED: Glucose-Capillary: 370 mg/dL — ABNORMAL HIGH (ref 65–99)

## 2016-04-01 MED ORDER — SODIUM CHLORIDE 0.9 % IV BOLUS (SEPSIS)
2000.0000 mL | Freq: Once | INTRAVENOUS | Status: AC
  Administered 2016-04-01: 2000 mL via INTRAVENOUS

## 2016-04-01 NOTE — ED Triage Notes (Signed)
See the previous ed note entered

## 2016-04-01 NOTE — NC FL2 (Addendum)
  [place] LEVEL OF CARE SCREENING TOOL     IDENTIFICATION  Patient Name: Willie Andersen Birthdate: 04-May-1951 Sex: male Admission Date (Current Location): 04/01/2016  [facility] and [place] Number:  Herbalist and Address:  [facility],  [place], [place]      Provider Number: [id number]  Attending Physician Name and Address:  Lacretia Leigh, MD  Relative Name and Phone Number:       Current Level of Care: Hospital Recommended Level of Care: [place] Prior Approval Number:    Date Approved/Denied:   PASRR Number:  [id number]  Discharge Plan: SNF    Current Diagnoses: Patient Active Problem List   Diagnosis Date Noted  . DM (diabetes mellitus), type 2 ([place]) 09/19/2011  . Cocaine abuse 09/19/2011  . Stroke ([place]) 09/13/2011  . Tobacco abuse 09/13/2011  . GERD (gastroesophageal reflux disease) 09/13/2011  . Meningioma ([place]) 09/13/2011    Orientation RESPIRATION BLADDER Height & Weight     Self, Time, Situation  Normal Incontinent Weight:   Height:     BEHAVIORAL SYMPTOMS/MOOD NEUROLOGICAL BOWEL NUTRITION STATUS      Continent Diet (Normal)  AMBULATORY STATUS COMMUNICATION OF NEEDS Skin   Limited Assist Verbally Normal                       Personal Care Assistance Level of Assistance  Bathing, Dressing Bathing Assistance: Limited assistance   Dressing Assistance: Limited assistance     Functional Limitations Info             SPECIAL CARE FACTORS FREQUENCY  PT (By licensed PT)     PT Frequency: 5 OT Frequency: 5            Contractures      Additional Factors Info  Allergies, Code Status Code Status Info: Not on file  Allergies Info: NKA           Current Medications (04/01/2016):  This is the current hospital active medication list Current Facility-Administered Medications  Medication Dose Route Frequency Provider Last Rate Last Dose  . sodium chloride 0.9 %  bolus 2,000 mL  2,000 mL Intravenous Once Daleen Bo, MD       Current Outpatient Prescriptions  Medication Sig Dispense Refill  . insulin glargine (LANTUS SOLOSTAR) 100 UNIT/ML injection Inject 16 Units into the skin at bedtime. 10 mL 12  . Insulin Pen Needle 31G X 5 MM MISC Use it for insulin injection 100 each 11  . simvastatin (ZOCOR) 20 MG tablet Take 1 tablet (20 mg total) by mouth daily at 6 PM. 30 tablet 6     Discharge Medications:  Relevant Imaging Results:  Relevant Lab Results:   Additional Information    Merry Proud, LCSWA

## 2016-04-01 NOTE — ED Triage Notes (Signed)
Pt comes to ed  Via ems, pt had a fall 1 hour ago, fell on the floor unwitnessed and no LOC,  Pt denies head impact. Pt is non compliant for his insulin and home meds. Pt not on blood thinners.  Pt lives with his daughter and daughter wants him in a nursing facility. Pt complains left thigh pain ( some edema noted and tender)   Iv in 22 right hand, sinus tach on monitor  CBG 350 ( 8:50pm)  Pt denies taking his insulin. Pt lost his home med's.   Alert and oriented. cva with left side weakness. Pt is inconstant.   V/s on arrival 128/86, pulse 106, rr 14, SpO2 100 room air. Daughter on the way. Full code.  Pt comes from [facility] [employer], [place], [place].

## 2016-04-01 NOTE — ED Notes (Signed)
Triage notation was entered under my techs name,

## 2016-04-01 NOTE — Progress Notes (Addendum)
CSW contacted pt's daughter, Levada Dy, to discuss plans for discharge once stable. Pt's daughter stated that she would prefer patient to go to a nursing home for long-term care. CSW informed pt's daughter that a SNF would not be available until the morning due to it being late at night and informed pt's daughter that CSW was unsure if pt would be discharging in the next few hours/morning. Pt's daughter stated that she will not be picking pt up tonight if he is discharged. CSW faxed information to [facility]. Pt will need physical therapy evaluation to determine skill level.  CSW will continue to follow up.   Kingsley Spittle, [place] Clinical Social Worker [phone]

## 2016-04-01 NOTE — ED Notes (Signed)
Bed: [facility] Expected date:  Expected time:  Means of arrival:  Comments: EMS 65 yo male/fall-left leg pain/left sided weakness from prior CVA-hyperglycemia
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
## 2016-04-02 DIAGNOSIS — E1165 Type 2 diabetes mellitus with hyperglycemia: Secondary | ICD-10-CM | POA: Diagnosis not present

## 2016-04-02 LAB — CBG MONITORING, ED
GLUCOSE-CAPILLARY: 294 mg/dL — AB (ref 65–99)
GLUCOSE-CAPILLARY: 264 mg/dL — AB (ref 65–99)
GLUCOSE-CAPILLARY: 210 mg/dL — AB (ref 65–99)

## 2016-04-02 MED ORDER — POTASSIUM CHLORIDE CRYS ER 20 MEQ PO TBCR
40.0000 meq | EXTENDED_RELEASE_TABLET | Freq: Once | ORAL | Status: AC
  Administered 2016-04-02: 40 meq via ORAL
  Filled 2016-04-02: qty 2

## 2016-04-02 MED ORDER — INSULIN GLARGINE 100 UNIT/ML ~~LOC~~ SOLN
16.0000 [IU] | Freq: Every day | SUBCUTANEOUS | Status: DC
  Administered 2016-04-02 (×2): 16 [IU] via SUBCUTANEOUS
  Filled 2016-04-02 (×2): qty 0.16

## 2016-04-02 MED ORDER — SIMVASTATIN 20 MG PO TABS
20.0000 mg | ORAL_TABLET | Freq: Every day | ORAL | Status: DC
  Administered 2016-04-02: 20 mg via ORAL
  Filled 2016-04-02: qty 1

## 2016-04-02 NOTE — Progress Notes (Signed)
ED CM consulted by SW assistant director [place] ED SW working on possible placement, pt listed with [facility] Discussed [facility] ED CM spoke with EDP, Zenia Resides about possible CT scan for pt - Dr Zenia Resides noted pt last CT scan done 4 yrs ago and previous EDP, Eulis Foster note indicating pt found without trauma to head and not needing CT scan during his evaluation.   ED Cm updated SW Surveyor, quantity Zack on EDP response and noted entered in EPIC via Voice message

## 2016-04-02 NOTE — Progress Notes (Signed)
[facility] ([id number]) is ready to accept pt. RN to call [facility].  Daughter notified of pending tx by CSW.  Creta Levin, LCSW Evening/ED Coverage [phone]

## 2016-04-02 NOTE — ED Notes (Signed)
Attempted to call report to [facility], no answer. Will call again.

## 2016-04-02 NOTE — Evaluation (Signed)
Physical Therapy Evaluation Patient Details Name: Willie Andersen MRN: [id number] DOB: 12/16/1950 Today's Date: 04/02/2016   History of Present Illness  Pt is a 65 year old male brought to ED after being found down on floor and found to have hyperglycemia with PMHx of stroke with residual L hemiplegia, subdural hematoma due to TBI, brain tumor s/p resection  Clinical Impression  Pt admitted with above diagnosis. Pt currently with functional limitations due to the deficits listed below (see PT Problem List).  Pt will benefit from skilled PT to increase their independence and safety with mobility to allow discharge to the venue listed below.  Pt reports he lives in bottom area of daughter's home and is typically modified independent, uses cane for mobility.  Pt requiring assist today for mobility and agreeable to SNF for short term rehab.  Pt states he has been to [place] and would prefer this location.     Follow Up Recommendations SNF    Equipment Recommendations  None recommended by PT    Recommendations for Other Services       Precautions / Restrictions Precautions Precautions: Fall Restrictions Other Position/Activity Restrictions: residual L hemiplegia      Mobility  Bed Mobility Overal bed mobility: Needs Assistance Bed Mobility: Supine to Sit;Sit to Supine     Supine to sit: Min assist Sit to supine: Min assist   General bed mobility comments: assist for trunk upright and L LE onto bed  Transfers Overall transfer level: Needs assistance Equipment used: Rolling walker (2 wheeled) Transfers: Sit to/from Stand Sit to Stand: Mod assist;From elevated surface         General transfer comment: pt unable to stand without UE assist, pt typically uses cane however had RW available so PT held other side of RW, assist to rise and steady  Ambulation/Gait Ambulation/Gait assistance: Min assist Ambulation Distance (Feet): 25 Feet Assistive device: Rolling walker (2  wheeled) Gait Pattern/deviations: Step-to pattern;Decreased weight shift to left     General Gait Details: pt typically uses cane however had RW available so PT held other side of RW, less assist required with increased distance, step to gait leading with L LE, L spastic ankle inversion observed during gait  Stairs            Wheelchair Mobility    Modified Rankin (Stroke Patients Only)       Balance Overall balance assessment:  (pt denies falls (uncertain of accuracy?))                                           Pertinent Vitals/Pain Pain Assessment: No/denies pain    Home Living Family/patient expects to be discharged to:: Private residence Living Arrangements: Children (daughter)           Home Layout: One level Home Equipment: Kasandra Knudsen - single point;Shower seat      Prior Function Level of Independence: Independent with assistive device(s)               Hand Dominance        Extremity/Trunk Assessment   Upper Extremity Assessment: LUE deficits/detail       LUE Deficits / Details: L flexion contractures and hemiplegia    Lower Extremity Assessment: LLE deficits/detail;Generalized weakness   LLE Deficits / Details: hx L hemiplegia, spastic ankle inversion observed during gait     Communication   Communication: No  difficulties  Cognition Arousal/Alertness: Awake/alert Behavior During Therapy: WFL for tasks assessed/performed Overall Cognitive Status: Within Functional Limits for tasks assessed                      General Comments      Exercises        Assessment/Plan    PT Assessment Patient needs continued PT services  PT Diagnosis Difficulty walking;Generalized weakness   PT Problem List Decreased strength;Decreased activity tolerance;Decreased mobility;Decreased balance  PT Treatment Interventions DME instruction;Gait training;Therapeutic exercise;Balance training;Therapeutic activities;Patient/family  education;Functional mobility training   PT Goals (Current goals can be found in the Care Plan section) Acute Rehab PT Goals PT Goal Formulation: With patient Time For Goal Achievement: 04/09/16 Potential to Achieve Goals: Good    Frequency Min 3X/week   Barriers to discharge        Co-evaluation               End of Session Equipment Utilized During Treatment: Gait belt Activity Tolerance: Patient tolerated treatment well Patient left: in bed;with call bell/phone within reach Nurse Communication: Mobility status    Functional Assessment Tool Used: clinical judgement Functional Limitation: Mobility: Walking and moving around Mobility: Walking and Moving Around Current Status [id number]): At least 40 percent but less than 60 percent impaired, limited or restricted Mobility: Walking and Moving Around Goal Status [phone]): At least 1 percent but less than 20 percent impaired, limited or restricted    Time: 0912-0930 PT Time Calculation (min) (ACUTE ONLY): 18 min   Charges:   PT Evaluation $PT Eval Moderate Complexity: 1 Procedure     PT G Codes:   PT G-Codes **NOT FOR INPATIENT CLASS** Functional Assessment Tool Used: clinical judgement Functional Limitation: Mobility: Walking and moving around Mobility: Walking and Moving Around Current Status [id number]): At least 40 percent but less than 60 percent impaired, limited or restricted Mobility: Walking and Moving Around Goal Status [id number]): At least 1 percent but less than 20 percent impaired, limited or restricted    Ansar Skoda,KATHrine E 04/02/2016, 10:19 AM Carmelia Bake, PT, DPT 04/02/2016 Pager: [phone]

## 2016-04-02 NOTE — Progress Notes (Signed)
Inpatient Diabetes Program Recommendations  AACE/ADA: New Consensus Statement on Inpatient Glycemic Control (2015)  Target Ranges:  Prepandial:   less than 140 mg/dL      Peak postprandial:   less than 180 mg/dL (1-2 hours)      Critically ill patients:  140 - 180 mg/dL   Lab Results  Component Value Date   GLUCAP 264 (H) 04/02/2016   HGBA1C 10.9 (H) 09/14/2011   Results for PORTLAND, MINTEN (MRN [id number]) as of 04/02/2016 17:50  Ref. Range 04/01/2016 21:46 04/02/2016 02:01 04/02/2016 06:33  Glucose-Capillary Latest Ref Range: 65 - 99 mg/dL 370 (H) 294 (H) 264 (H)   Review of Glycemic Control  Diabetes history: DM2 Outpatient Diabetes medications: Lantus 16 units QHS Current orders for Inpatient glycemic control: Lantus 16 units QHS.  Inpatient Diabetes Program Recommendations:    Add Novolog sensitive tidwc Check HgbA1C to assess glycemic control prior to admission.  Will follow. Thank you. Lorenda Peck, RD, LDN, CDE Inpatient Diabetes Coordinator [phone]

## 2016-04-02 NOTE — ED Notes (Signed)
Physical Therapy at bedside.

## 2016-04-02 NOTE — Progress Notes (Signed)
CSW spoke with patient at bedside. Patient agrees to go to a SNF, preferrably [facility]. CSW informed patient that no guarantee could be made that he would be able to go to this facility.   CSW spoke with Clarene Critchley, Engineer, site at [facility]. She stated they do not have any male beds.  CSW spoke with Gayla Medicus, RN Liasion for [employer]. She stated after review of patient's information, they would be able to accept patient at [facility]. FL2 was updated and reviewed with EDP to ensure medications were correct for discharge. Spoke with patient's daughter, Alanson Puls who stated she agreed for patient to go to this facility, however, she states patient would have to sign his own forms for his [employer] insurance to be switched over to [employer]. Patient's daughter stated she did not get off until 500 today. Spoke with Gayla Medicus and she stated she would need for patient to sign written consent that he agreed to switch his [employer] to [employer]. CSW completed the note and with the witness of patient's Nurse, written note was read and explained to patient and he signed and agreed. Patient also was asked and agreed to call [facility] by phone to inform them of the situation.  Zacarias Pontes ED CSW updated and informed of patient's situation and that she would receive a call from [facility] when patient could be sent to the facility.   Genice Rouge [id number] ED CSW 04/02/2016 4:19 PM

## 2016-04-02 NOTE — ED Notes (Signed)
Notified RN,Kelly pt. CBG 210.

## 2016-04-02 NOTE — ED Notes (Signed)
Introduced self to pt.  Pt a/o x 4.  Pt states he is hungry but denies any pain.  Pt's BP re-checked and it was not 79/45 but instead 161/96.  Pt states he does not need any help getting cleaned.  Pt was handed his phone.

## 2016-04-02 NOTE — ED Notes (Signed)
Per pt request, pt given ice water.

## 2016-04-03 LAB — URINE CULTURE

## 2016-04-06 ENCOUNTER — Non-Acute Institutional Stay (SKILLED_NURSING_FACILITY): Payer: Medicare HMO | Admitting: Adult Health

## 2016-04-06 ENCOUNTER — Encounter: Payer: Self-pay | Admitting: Adult Health

## 2016-04-06 DIAGNOSIS — K219 Gastro-esophageal reflux disease without esophagitis: Secondary | ICD-10-CM | POA: Diagnosis not present

## 2016-04-06 DIAGNOSIS — W19XXXD Unspecified fall, subsequent encounter: Secondary | ICD-10-CM | POA: Diagnosis not present

## 2016-04-06 DIAGNOSIS — I639 Cerebral infarction, unspecified: Secondary | ICD-10-CM | POA: Diagnosis not present

## 2016-04-06 DIAGNOSIS — Y92009 Unspecified place in unspecified non-institutional (private) residence as the place of occurrence of the external cause: Secondary | ICD-10-CM | POA: Insufficient documentation

## 2016-04-06 DIAGNOSIS — W19XXXA Unspecified fall, initial encounter: Secondary | ICD-10-CM | POA: Insufficient documentation

## 2016-04-06 DIAGNOSIS — E1169 Type 2 diabetes mellitus with other specified complication: Secondary | ICD-10-CM | POA: Diagnosis not present

## 2016-04-06 DIAGNOSIS — E876 Hypokalemia: Secondary | ICD-10-CM

## 2016-04-06 DIAGNOSIS — D329 Benign neoplasm of meninges, unspecified: Secondary | ICD-10-CM | POA: Diagnosis not present

## 2016-04-06 DIAGNOSIS — E785 Hyperlipidemia, unspecified: Secondary | ICD-10-CM
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
## 2016-04-07 LAB — CBC AND DIFFERENTIAL
HCT: 47 % (ref 41–53)
HEMOGLOBIN: 15.3 g/dL (ref 13.5–17.5)
Platelets: 316 10*3/uL (ref 150–399)
WBC: 4.6 10^3/mL

## 2016-04-07 LAB — LIPID PANEL
Cholesterol: 164 mg/dL (ref 0–200)
HDL: 45 mg/dL (ref 35–70)
LDL Cholesterol: 100 mg/dL
Triglycerides: 95 mg/dL (ref 40–160)

## 2016-04-07 LAB — HEPATIC FUNCTION PANEL
ALT: 15 U/L (ref 10–40)
AST: 16 U/L (ref 14–40)
Alkaline Phosphatase: 72 U/L (ref 25–125)
BILIRUBIN, TOTAL: 0.6 mg/dL

## 2016-04-07 LAB — BASIC METABOLIC PANEL
BUN: 7 mg/dL (ref 4–21)
CREATININE: 0.7 mg/dL (ref 0.6–1.3)
GLUCOSE: 213 mg/dL
POTASSIUM: 4.8 mmol/L (ref 3.4–5.3)
SODIUM: 136 mmol/L — AB (ref 137–147)

## 2016-04-12 ENCOUNTER — Encounter: Payer: Self-pay | Admitting: Internal Medicine

## 2016-04-12 ENCOUNTER — Non-Acute Institutional Stay (SKILLED_NURSING_FACILITY): Payer: Medicare HMO | Admitting: Internal Medicine

## 2016-04-12 DIAGNOSIS — E1169 Type 2 diabetes mellitus with other specified complication: Secondary | ICD-10-CM | POA: Diagnosis not present

## 2016-04-12 DIAGNOSIS — E876 Hypokalemia: Secondary | ICD-10-CM | POA: Diagnosis not present

## 2016-04-12 DIAGNOSIS — F1411 Cocaine abuse, in remission: Secondary | ICD-10-CM

## 2016-04-12 DIAGNOSIS — M6249 Contracture of muscle, multiple sites: Secondary | ICD-10-CM | POA: Diagnosis not present

## 2016-04-12 DIAGNOSIS — E785 Hyperlipidemia, unspecified: Secondary | ICD-10-CM

## 2016-04-12 DIAGNOSIS — I6930 Unspecified sequelae of cerebral infarction: Secondary | ICD-10-CM | POA: Diagnosis not present

## 2016-04-12 DIAGNOSIS — M62838 Other muscle spasm: Secondary | ICD-10-CM

## 2016-04-12 DIAGNOSIS — F1410 Cocaine abuse, uncomplicated: Secondary | ICD-10-CM
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
## 2016-05-18 ENCOUNTER — Encounter: Payer: Self-pay | Admitting: Adult Health

## 2016-05-18 ENCOUNTER — Non-Acute Institutional Stay (SKILLED_NURSING_FACILITY): Payer: Medicare HMO | Admitting: Adult Health

## 2016-05-18 DIAGNOSIS — I639 Cerebral infarction, unspecified: Secondary | ICD-10-CM | POA: Diagnosis not present

## 2016-05-18 DIAGNOSIS — E1149 Type 2 diabetes mellitus with other diabetic neurological complication: Secondary | ICD-10-CM | POA: Diagnosis not present

## 2016-05-18 DIAGNOSIS — K219 Gastro-esophageal reflux disease without esophagitis: Secondary | ICD-10-CM | POA: Diagnosis not present

## 2016-05-18 DIAGNOSIS — D329 Benign neoplasm of meninges, unspecified: Secondary | ICD-10-CM | POA: Diagnosis not present

## 2016-05-18 DIAGNOSIS — E876 Hypokalemia: Secondary | ICD-10-CM | POA: Diagnosis not present

## 2016-05-18 DIAGNOSIS — E785 Hyperlipidemia, unspecified: Secondary | ICD-10-CM | POA: Diagnosis not present

## 2016-05-18 DIAGNOSIS — E1169 Type 2 diabetes mellitus with other specified complication: Secondary | ICD-10-CM
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
## 2016-06-02 ENCOUNTER — Encounter: Payer: Self-pay | Admitting: Gastroenterology

## 2016-06-11 ENCOUNTER — Non-Acute Institutional Stay (SKILLED_NURSING_FACILITY): Payer: Medicare HMO | Admitting: Adult Health

## 2016-06-11 ENCOUNTER — Encounter: Payer: Self-pay | Admitting: Adult Health

## 2016-06-11 DIAGNOSIS — E1149 Type 2 diabetes mellitus with other diabetic neurological complication: Secondary | ICD-10-CM

## 2016-06-11 DIAGNOSIS — E785 Hyperlipidemia, unspecified: Secondary | ICD-10-CM

## 2016-06-11 DIAGNOSIS — E876 Hypokalemia: Secondary | ICD-10-CM

## 2016-06-11 DIAGNOSIS — E1169 Type 2 diabetes mellitus with other specified complication: Secondary | ICD-10-CM

## 2016-06-11 DIAGNOSIS — I639 Cerebral infarction, unspecified: Secondary | ICD-10-CM | POA: Diagnosis not present

## 2016-06-11 DIAGNOSIS — K219 Gastro-esophageal reflux disease without esophagitis: Secondary | ICD-10-CM | POA: Diagnosis not present
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
## 2016-06-30 ENCOUNTER — Encounter: Payer: Self-pay | Admitting: Adult Health

## 2016-06-30 ENCOUNTER — Non-Acute Institutional Stay (SKILLED_NURSING_FACILITY): Payer: Medicare HMO | Admitting: Adult Health

## 2016-06-30 DIAGNOSIS — E1149 Type 2 diabetes mellitus with other diabetic neurological complication: Secondary | ICD-10-CM

## 2016-06-30 DIAGNOSIS — E1169 Type 2 diabetes mellitus with other specified complication: Secondary | ICD-10-CM

## 2016-06-30 DIAGNOSIS — E785 Hyperlipidemia, unspecified: Secondary | ICD-10-CM | POA: Diagnosis not present

## 2016-06-30 DIAGNOSIS — I639 Cerebral infarction, unspecified: Secondary | ICD-10-CM | POA: Diagnosis not present

## 2016-06-30 DIAGNOSIS — K219 Gastro-esophageal reflux disease without esophagitis: Secondary | ICD-10-CM | POA: Diagnosis not present
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
## 2016-07-30 ENCOUNTER — Other Ambulatory Visit: Payer: Self-pay | Admitting: Internal Medicine

## 2016-10-18 ENCOUNTER — Other Ambulatory Visit: Payer: Self-pay | Admitting: Internal Medicine

## 2019-09-24 DIAGNOSIS — Z20828 Contact with and (suspected) exposure to other viral communicable diseases: Secondary | ICD-10-CM | POA: Diagnosis not present

## 2019-09-28 DIAGNOSIS — Z9189 Other specified personal risk factors, not elsewhere classified: Secondary | ICD-10-CM | POA: Diagnosis not present

## 2019-09-28 DIAGNOSIS — E782 Mixed hyperlipidemia: Secondary | ICD-10-CM | POA: Diagnosis not present

## 2019-09-28 DIAGNOSIS — F331 Major depressive disorder, recurrent, moderate: Secondary | ICD-10-CM | POA: Diagnosis not present

## 2019-10-01 DIAGNOSIS — Z20828 Contact with and (suspected) exposure to other viral communicable diseases: Secondary | ICD-10-CM | POA: Diagnosis not present

## 2019-10-15 DIAGNOSIS — R2689 Other abnormalities of gait and mobility: Secondary | ICD-10-CM | POA: Diagnosis not present

## 2019-10-15 DIAGNOSIS — L605 Yellow nail syndrome: Secondary | ICD-10-CM | POA: Diagnosis not present

## 2019-10-15 DIAGNOSIS — B351 Tinea unguium: Secondary | ICD-10-CM | POA: Diagnosis not present

## 2019-10-15 DIAGNOSIS — M79673 Pain in unspecified foot: Secondary | ICD-10-CM | POA: Diagnosis not present

## 2019-10-15 DIAGNOSIS — E1140 Type 2 diabetes mellitus with diabetic neuropathy, unspecified: Secondary | ICD-10-CM | POA: Diagnosis not present

## 2019-10-26 DIAGNOSIS — Z9189 Other specified personal risk factors, not elsewhere classified: Secondary | ICD-10-CM | POA: Diagnosis not present

## 2019-10-26 DIAGNOSIS — F331 Major depressive disorder, recurrent, moderate: Secondary | ICD-10-CM | POA: Diagnosis not present

## 2019-10-26 DIAGNOSIS — E782 Mixed hyperlipidemia: Secondary | ICD-10-CM | POA: Diagnosis not present

## 2019-10-26 DIAGNOSIS — F5109 Other insomnia not due to a substance or known physiological condition: Secondary | ICD-10-CM | POA: Diagnosis not present

## 2019-11-15 DIAGNOSIS — Z20828 Contact with and (suspected) exposure to other viral communicable diseases: Secondary | ICD-10-CM | POA: Diagnosis not present

## 2020-07-30 ENCOUNTER — Other Ambulatory Visit: Payer: Self-pay

## 2020-07-30 ENCOUNTER — Encounter (HOSPITAL_COMMUNITY): Payer: Self-pay | Admitting: Student

## 2020-07-30 ENCOUNTER — Emergency Department (HOSPITAL_COMMUNITY)
Admission: EM | Admit: 2020-07-30 | Discharge: 2020-07-30 | Disposition: A | Discharge status: Home / Self Care | Payer: Medicare HMO | Attending: Emergency Medicine | Admitting: Emergency Medicine

## 2020-07-30 DIAGNOSIS — Z794 Long term (current) use of insulin: Secondary | ICD-10-CM | POA: Diagnosis not present

## 2020-07-30 DIAGNOSIS — F17210 Nicotine dependence, cigarettes, uncomplicated: Secondary | ICD-10-CM | POA: Insufficient documentation

## 2020-07-30 DIAGNOSIS — Z7982 Long term (current) use of aspirin: Secondary | ICD-10-CM | POA: Insufficient documentation

## 2020-07-30 DIAGNOSIS — E860 Dehydration: Secondary | ICD-10-CM | POA: Diagnosis not present

## 2020-07-30 DIAGNOSIS — E119 Type 2 diabetes mellitus without complications: Secondary | ICD-10-CM | POA: Insufficient documentation

## 2020-07-30 LAB — CBG MONITORING, ED: Glucose-Capillary: 90 mg/dL (ref 70–99)

## 2020-07-30 MED ORDER — ONDANSETRON 8 MG PO TBDP
8.0000 mg | ORAL_TABLET | Freq: Once | ORAL | Status: AC
  Administered 2020-07-30: 8 mg via ORAL
  Filled 2020-07-30: qty 1

## 2020-07-30 MED ORDER — SODIUM CHLORIDE 0.9 % IV BOLUS
1000.0000 mL | Freq: Once | INTRAVENOUS | Status: AC
  Administered 2020-07-30: 1000 mL via INTRAVENOUS

## 2020-07-30 NOTE — ED Notes (Signed)
[facility]AR present to transport patient back to facility.

## 2020-07-30 NOTE — ED Notes (Signed)
Offered patient something to eat/drink, he refused.

## 2020-07-30 NOTE — ED Notes (Addendum)
[facility]AR contacted for transport back to [facility] and report called to [place] at [facility].

## 2020-07-30 NOTE — Discharge Instructions (Addendum)
Drink plenty of fluids.  Start back taking her medicines tomorrow.

## 2020-07-30 NOTE — ED Notes (Signed)
Patient feeling nauseous upon PTAR arrival back to facility.  [place], EDP made aware and received order for zofran-ODT.

## 2020-07-30 NOTE — ED Notes (Signed)
Patient again offered a sandwich, he doesn't want one.  Also offered something to drink and he wanted some more orange juice.  Given orange juice and reassured that PTAR should arrive soon to take him back to [facility].

## 2020-07-30 NOTE — ED Triage Notes (Signed)
Patient BIB [facility] from [facility].  Patient was given the wrong medication this morning around 0840.  Nurse stated she set another patient's meds on the counter and the patient grabbed them and just tossed them back.    Vitals were 102/68 82-HR  18-RR 97% on room air [id number]
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
## 2020-07-30 NOTE — ED Notes (Signed)
Pt couldn't stay standing up to complete standing bp and pulse was able to complete sitting and laying bp and pulse

## 2022-02-24 ENCOUNTER — Ambulatory Visit (INDEPENDENT_AMBULATORY_CARE_PROVIDER_SITE_OTHER): Payer: Medicare (Managed Care)

## 2022-02-24 ENCOUNTER — Ambulatory Visit (INDEPENDENT_AMBULATORY_CARE_PROVIDER_SITE_OTHER): Payer: Medicare (Managed Care) | Admitting: Podiatry

## 2022-02-24 ENCOUNTER — Encounter: Payer: Self-pay | Admitting: Podiatry

## 2022-02-24 DIAGNOSIS — S91205A Unspecified open wound of left lesser toe(s) with damage to nail, initial encounter: Secondary | ICD-10-CM

## 2022-02-24 DIAGNOSIS — S91209A Unspecified open wound of unspecified toe(s) with damage to nail, initial encounter: Secondary | ICD-10-CM

## 2022-02-24 DIAGNOSIS — S99922A Unspecified injury of left foot, initial encounter: Secondary | ICD-10-CM

## 2022-02-24 NOTE — Patient Instructions (Signed)
Soak Instructions    THE DAY AFTER THE PROCEDURE  Place 1/4 cup of epsom salts (or betadine, or white vinegar) in a quart of warm tap water.  Submerge your foot or feet with outer bandage intact for the initial soak; this will allow the bandage to become moist and wet for easy lift off.  Once you remove your bandage, continue to soak in the solution for 20 minutes.  This soak should be done twice a day.  Next, remove your foot or feet from solution, blot dry the affected area and cover.  Leave the toe open to air  IF YOUR SKIN BECOMES IRRITATED WHILE USING THESE INSTRUCTIONS, IT IS OKAY TO SWITCH TO  WHITE VINEGAR AND WATER. Or you may use antibacterial soap and water to keep the toe clean  Monitor for any signs/symptoms of infection. Call the office immediately if any occur or go directly to the emergency room. Call with any questions/concerns.

## 2022-02-24 NOTE — Progress Notes (Signed)
  Subjective:  Patient ID: Willie Andersen, male    DOB: 1950/11/17,  MRN: [id number]  Chief Complaint  Patient presents with   Foot Pain    diabtic with left foot swelling and injury to left great toe    71 y.o. male presents with the above complaint. History confirmed with patient.  He is in assisted living facility, at some point in the left great toenail was avulsed, they are not sure exactly how it happened.  He says it does not hurt.  They wanted to have it evaluated as he is diabetic.  His A1c is unknown but his blood sugars are usually around 100 in the mornings  Objective:  Physical Exam: warm, good capillary refill, no trophic changes or ulcerative lesions, normal DP and PT pulses, and left hallux nail has been avulsed, no drainage or bleeding actively, no signs of infection, proximal nail plate appears to be healing and growing back.   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 Traumatic avulsion of nail plate of toe, initial encounter      Plan:  Patient was evaluated and treated and all questions answered.  I reviewed the results of the radiographs with the patient and the caretaker and family.  Discussed there is no evidence of fracture.  Currently does not have any pain ecchymosis or signs of infection.  Nail appears to be healing well.  Should heal uneventfully.  There is some dried blood and I advised him he can continue soaks for the next 1 to 2 weeks and leave open to air, return as needed if it does not heal or worsens at any point  Return if symptoms worsen or fail to improve.

## 2023-01-26 ENCOUNTER — Emergency Department (HOSPITAL_COMMUNITY)
Admission: EM | Admit: 2023-01-26 | Discharge: 2023-01-26 | Disposition: A | Discharge status: Home / Self Care | Payer: Medicare (Managed Care) | Attending: Emergency Medicine | Admitting: Emergency Medicine

## 2023-01-26 ENCOUNTER — Emergency Department (HOSPITAL_COMMUNITY): Payer: Medicare (Managed Care)

## 2023-01-26 ENCOUNTER — Other Ambulatory Visit: Payer: Self-pay

## 2023-01-26 ENCOUNTER — Encounter (HOSPITAL_COMMUNITY): Payer: Self-pay

## 2023-01-26 DIAGNOSIS — Z85841 Personal history of malignant neoplasm of brain: Secondary | ICD-10-CM | POA: Insufficient documentation

## 2023-01-26 DIAGNOSIS — W19XXXA Unspecified fall, initial encounter: Secondary | ICD-10-CM | POA: Insufficient documentation

## 2023-01-26 DIAGNOSIS — R531 Weakness: Secondary | ICD-10-CM | POA: Insufficient documentation

## 2023-01-26 DIAGNOSIS — Z7982 Long term (current) use of aspirin: Secondary | ICD-10-CM | POA: Diagnosis not present

## 2023-01-26 DIAGNOSIS — F17200 Nicotine dependence, unspecified, uncomplicated: Secondary | ICD-10-CM | POA: Diagnosis not present

## 2023-01-26 DIAGNOSIS — R079 Chest pain, unspecified: Secondary | ICD-10-CM | POA: Insufficient documentation

## 2023-01-26 DIAGNOSIS — Z794 Long term (current) use of insulin: Secondary | ICD-10-CM | POA: Insufficient documentation

## 2023-01-26 DIAGNOSIS — E119 Type 2 diabetes mellitus without complications: Secondary | ICD-10-CM | POA: Diagnosis not present

## 2023-01-26 MED ORDER — ACETAMINOPHEN 500 MG PO TABS
1000.0000 mg | ORAL_TABLET | Freq: Once | ORAL | Status: AC
  Administered 2023-01-26: 1000 mg via ORAL
  Filled 2023-01-26: qty 2

## 2023-01-26 NOTE — ED Triage Notes (Signed)
Pt from [facility]. Left sided deficits from previous stroke. Pt is alert and oriented x 4. Pt denies hitting head, no blood thinner, Pt has sternum pain and right arm pain. Fall was unwitnessed, but staff relays the fall was about 330 am. Pt was found around 6 am. Pt relays he slipped and fell when trying to get back in to bed.

## 2023-01-26 NOTE — Discharge Instructions (Addendum)
Willie Andersen,  It was a pleasure taking care of you while you were in the hospital. You were brought in by emergency medical services when the staff at your facility found you lying on the floor. The fall was minor based on your description and you have been feeling good without any concerning symptoms. Since you were having some right-sided chest pain, we ordered an Xray to ensure that your bones were all intact without any fractures or displacement. This Xray was negative. We also gave you some acetaminophen for pain relief.  Please return to the emergency department if you experience another fall or develop concerning symptoms like headache or vomiting.

## 2023-01-26 NOTE — ED Notes (Signed)
Notified Diplomatic Services operational officer to call PTAR for pt

## 2023-01-26 NOTE — ED Notes (Signed)
Reviewed discharge instructions with patient. Follow-up care reviewed. Patient verbalized understanding. Patient A&Ox4, VSS upon discharge.  

## 2023-01-26 NOTE — ED Provider Notes (Signed)
Willie Andersen   CSN: [id number] Arrival date & time: 01/26/23  5621     History  Hyperlipidemia Diabetes II Cerebrovascular accident  Meningioma  Cocaine use Tobacco use Gastroesophageal reflux disease   Chief Complaint  Patient presents with   Willie Andersen is a 72 y.o. male with a past medical history of hyperlipidemia, diabetes II, cerebrovascular accident 2011, meningioma, cocaine use, and tobacco use who was delivered to the [facility] ED by EMS from an assisted living facility after a fall.  History notable for two prior cerebrovascular events that have resulted in chronic left-sided hemiparesis. Ambulates with a wheelchair at baseline and sometimes uses a quad walker for shorter distances. Patient explains that he was self-transferring from wheelchair to bed last night when his grip slipped and he fell onto the floor. States that he caught himself and landed softly on the right side of his body. Given his baseline weakness, he was unable to stand and fell asleep on the floor. Facility staff called EMS when they discovered patient lying on the floor earlier this morning. Denies lightheadedness, dizziness, cough, dysuria, fever, chills, palpitations, headache, visual disturbances, head trauma, lost consciousness, and can recall the entire event. Only source of pain is right lateral chest pain located directly below axilla. Describes the pain as a dull achy sensation. Neither deep inhalation nor application of pressure exacerbate this pain. Home medications include aspirin and insulin, regimen has remained unchanged recently.    Home Medications Prior to Admission medications   Medication Sig Start Date End Date Taking? Authorizing Provider  aspirin EC 81 MG tablet Take 81 mg by mouth daily.    [provider]  atorvastatin (LIPITOR) 10 MG tablet Take 10 mg by mouth at bedtime.    [provider]   baclofen (LIORESAL) 10 MG tablet Take 5 mg by mouth 2 (two) times daily.     [provider]  insulin aspart (NOVOLOG) 100 UNIT/ML injection Inject 5 Units into the skin 3 (three) times daily before meals. Inject as per sliding scale: if  0-149= 0 units; 150 + = 5 units, subcutaneously before meals related to DM.    [provider]  Insulin Glargine (LANTUS SOLOSTAR) 100 UNIT/ML Solostar Pen Inject 25 Units into the skin daily at 10 pm.    [provider]      Allergies    Patient has no known allergies.    Review of Systems   Review of Systems  Dull achy upper right lateral rib pain   Physical Exam Updated Vital Signs BP (!) 148/84 (BP Location: Right Arm)   Pulse 75   Temp 98.5 F (36.9 C) (Oral)   Resp 16   Ht 5\' 10"  (1.778 m)   Wt 70 kg   SpO2 99%   BMI 22.14 kg/m  Physical Exam  Awake and alert, fully oriented, cooperative, not in acute distress Mild pain in lateral upper chest with R arm full RoM, no tenderness or ecchymosis Extraocular movements intact, pupils round and reactive bilaterally Normal respiratory effort, lungs clear to auscultation, no crackles or wheezing Abdomen soft and non-distended, normoactive sounds, no guarding or rigidity  Cranial nerves II-XII intact, sensation symmetrical across all four extremities Strength 4+/5 RUE 4/5 RLE 0-1/5 LEU 0-1 LLE   ED Results / Procedures / Treatments   Labs (all labs ordered are listed, but only abnormal results are displayed) Labs Reviewed - No data to  display  EKG EKG Interpretation  Date/Time:  Wednesday January 26 2023 07:38:12 EDT Ventricular Rate:  72 PR Interval:  142 QRS Duration: 91 QT Interval:  383 QTC Calculation: 420 R Axis:   -46 Text Interpretation: Sinus rhythm Left anterior fascicular block Abnormal R-wave progression, early transition Borderline T wave abnormalities No significant change since last tracing Confirmed by Gwyneth Sprout ([id number]) on 01/26/2023  7:40:42 AM  Radiology DG Chest 2 View  Result Date: 01/26/2023 CLINICAL DATA:  Left-sided chest pain after a fall. EXAM: CHEST - 2 VIEW COMPARISON:  Chest radiograph 09/19/2011 FINDINGS: Heart is at the upper limits of normal for size. The upper mediastinal contours are normal. There is no focal consolidation or pulmonary edema. There is no pleural effusion or pneumothorax No displaced rib fracture or other acute osseous abnormality is identified. IMPRESSION: 1. No radiographic evidence of acute cardiopulmonary process. 2. No displaced rib fracture identified. Electronically Signed   By: Lesia Hausen M.D.   On: 01/26/2023 09:03    Procedures Procedures   None   Medications Ordered in ED Medications  acetaminophen (TYLENOL) tablet 1,000 mg (1,000 mg Oral Given 01/26/23 6045)    ED Course/ Medical Decision Making/ A&P   {   Click here for ABCD2, HEART and other calculator                         Medical Decision Making Amount and/or Complexity of Data Reviewed Radiology: ordered.  Risk OTC drugs.   Patient presents after mechanical fall sustained when self-transferring from wheelchair to bed last night. Assisted living facility staff discovered him lying on floor earlier this morning and called EMS. Denies headache, amnesia, prodromal symptoms, visual disturbances, and urinary or bowel incontinence. Reports some mild right-sided chest pain located along midaxillary line reproducible with active range of motion movements. Low concern for intracranial hemorrhage given lack of concerning symptoms and anticoagulation medications. Chest radiograph was negative for rib fracture or displacement. Acetaminophen administered for symptomatic relief, after which patient reported resolution of pain. Discharged with instructions to return if patient develops any new symptoms such as headache or vomiting.    Final Clinical Impression(s) / ED Diagnoses Final diagnoses:  Accident due to mechanical  fall without injury, initial encounter    Rx / DC Orders ED Discharge Orders     None         Crissie Sickles, MD 01/26/23 4098    Gwyneth Sprout, MD 01/27/23 [id number]
